# Patient Record
Sex: Female | Born: 1950 | Race: White | Hispanic: No | Marital: Married | Smoking: Never smoker
Health system: Southern US, Community
[De-identification: ages and names within clinical notes are randomized; demographics above are authoritative.]

## PROBLEM LIST (undated history)

## (undated) DIAGNOSIS — J329 Chronic sinusitis, unspecified: Secondary | ICD-10-CM

## (undated) DIAGNOSIS — F32A Depression, unspecified: Secondary | ICD-10-CM

## (undated) DIAGNOSIS — D869 Sarcoidosis, unspecified: Secondary | ICD-10-CM

## (undated) DIAGNOSIS — I10 Essential (primary) hypertension: Secondary | ICD-10-CM

## (undated) DIAGNOSIS — M1990 Unspecified osteoarthritis, unspecified site: Secondary | ICD-10-CM

## (undated) DIAGNOSIS — Z9889 Other specified postprocedural states: Secondary | ICD-10-CM

## (undated) DIAGNOSIS — R112 Nausea with vomiting, unspecified: Secondary | ICD-10-CM

## (undated) DIAGNOSIS — J309 Allergic rhinitis, unspecified: Secondary | ICD-10-CM

## (undated) DIAGNOSIS — F329 Major depressive disorder, single episode, unspecified: Secondary | ICD-10-CM

## (undated) DIAGNOSIS — E039 Hypothyroidism, unspecified: Secondary | ICD-10-CM

## (undated) DIAGNOSIS — E119 Type 2 diabetes mellitus without complications: Secondary | ICD-10-CM

## (undated) HISTORY — DX: Hypothyroidism, unspecified: E03.9

## (undated) HISTORY — DX: Sarcoidosis, unspecified: D86.9

## (undated) HISTORY — DX: Allergic rhinitis, unspecified: J30.9

## (undated) HISTORY — PX: FOOT SURGERY: SHX648

## (undated) HISTORY — PX: APPENDECTOMY: SHX54

## (undated) HISTORY — DX: Chronic sinusitis, unspecified: J32.9

## (undated) HISTORY — PX: CHOLECYSTECTOMY: SHX55

## (undated) HISTORY — DX: Essential (primary) hypertension: I10

## (undated) HISTORY — DX: Type 2 diabetes mellitus without complications: E11.9

---

## 1997-09-28 HISTORY — PX: NASAL SINUS SURGERY: SHX719

## 1998-01-16 ENCOUNTER — Ambulatory Visit (HOSPITAL_COMMUNITY): Admission: RE | Admit: 1998-01-16 | Discharge: 1998-01-16 | Payer: Self-pay | Admitting: Thoracic Surgery

## 1998-02-21 ENCOUNTER — Encounter: Admission: RE | Admit: 1998-02-21 | Discharge: 1998-02-21 | Payer: Self-pay | Admitting: Infectious Diseases

## 1998-03-22 ENCOUNTER — Encounter: Admission: RE | Admit: 1998-03-22 | Discharge: 1998-03-22 | Payer: Self-pay | Admitting: Infectious Diseases

## 1999-11-24 ENCOUNTER — Other Ambulatory Visit: Admission: RE | Admit: 1999-11-24 | Discharge: 1999-11-24 | Payer: Self-pay | Admitting: *Deleted

## 2001-04-01 ENCOUNTER — Ambulatory Visit (HOSPITAL_COMMUNITY): Admission: RE | Admit: 2001-04-01 | Discharge: 2001-04-01 | Payer: Self-pay | Admitting: Family Medicine

## 2001-04-01 ENCOUNTER — Encounter: Payer: Self-pay | Admitting: Family Medicine

## 2001-06-07 ENCOUNTER — Other Ambulatory Visit: Admission: RE | Admit: 2001-06-07 | Discharge: 2001-06-07 | Payer: Self-pay | Admitting: *Deleted

## 2002-06-22 ENCOUNTER — Other Ambulatory Visit: Admission: RE | Admit: 2002-06-22 | Discharge: 2002-06-22 | Payer: Self-pay | Admitting: *Deleted

## 2007-12-08 ENCOUNTER — Inpatient Hospital Stay (HOSPITAL_COMMUNITY): Admission: AD | Admit: 2007-12-08 | Discharge: 2007-12-11 | Payer: Self-pay | Admitting: Family Medicine

## 2007-12-08 ENCOUNTER — Ambulatory Visit: Payer: Self-pay | Admitting: Internal Medicine

## 2010-09-28 HISTORY — PX: HAND SURGERY: SHX662

## 2011-02-10 NOTE — Consult Note (Signed)
NAME:  Amy Hendrix, Amy Hendrix              ACCOUNT NO.:  [id number]   MEDICAL RECORD NO.:  [id number]          PATIENT TYPE:  INP   LOCATION:  A340                          FACILITY:  [facility]   PHYSICIAN:  R. Roetta Sessions, M.D. DATE OF BIRTH:  [birth year]   DATE OF CONSULTATION:  12/08/2007  DATE OF DISCHARGE:                                 CONSULTATION   REASON FOR CONSULTATION:  Abdominal pain, gastroenteritis.   REQUESTING PHYSICIAN:  Scott A. Luking, MD.   HISTORY OF PRESENT ILLNESS:  This patient is a 60 year old Caucasian  female with a one-week history of general malaise, loss of appetite,  nausea, vomiting, diarrhea.  She complains of chills.  She states she  was fine up until last Thursday.  She woke up.  Throughout the day she  started having fatigue and nausea and vomiting, developed diarrhea.  She  denies any hematemesis, melena or bright red blood per rectum.  She has  had loose stools but having less than five stools a day.  She has not  had any vomiting today.  She says she is not able to eat.  She denies  any chronic GERD, dysphagia, or odynophagia.  No unintentional weight  loss except for maybe the past few days.  She had labs as an outpatient.  Dr. Gerda Diss reported to [place] that her white count was 27,000 with 20%  bands.  Apparently her LFTs were normal.   MEDICATIONS AT HOME:  1. Levothyroxine 112 mcg daily.  2. Atenolol 50 mg daily.  3. Potassium chloride 20 mEq three daily.  4. Indapamide 2.5 mg daily.  5. Tylenol p.r.n.   ALLERGIES:  No known drug allergies.   PAST MEDICAL HISTORY:  1. Hypothyroidism.  2. Hypertension.  3. Diet-controlled diabetes mellitus, recently diagnosed.  4. She states she was given diagnosis of sarcoidosis back in 1999, but      has not required any chronic therapy.  5. Appendectomy in the 70s.  6. Cholecystectomy.  7. Two cesarean sections.  8. Bilateral foot surgery.  9. Nasal reconstruction.   FAMILY HISTORY:  Brother had colonic  polyps at age 61.   SOCIAL HISTORY:  She is married.  She has two children.  No tobacco or  alcohol use.  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   REVIEW OF SYSTEMS:  See HPI for GI and constitutional.  CARDIOPULMONARY:  No chest pain or shortness of breath.   PHYSICAL EXAMINATION:  VITAL SIGNS:  Temperature 99.3, pulse 80,  respirations 20, blood pressure 123/78, height 63 inches, weight 108.5  kg.  GENERAL:  Pleasant, obese Caucasian female in no acute distress.  SKIN:  Warm, dry.  No jaundice.  HEENT:  Sclerae nonicteric.  Oropharyngeal mucosa moist and pink, no  lesions, erythema or exudate.  No lymphadenopathy.  CHEST:  Clear to auscultation.  CARDIAC:  Exam reveals regular rate and rhythm.  No murmurs, rubs,  gallops.  ABDOMEN:  Positive bowel sounds,  soft, nontender, nondistended.  No  organomegaly or masses.  No rebound or guarding.  No abdominal bruits or  hernias.  LOWER  EXTREMITIES:  No edema.   IMPRESSION:  The patient is a 60 year old female with nausea, vomiting,  diarrhea and malaise for one week.  Most compelling symptom to her is  the malaise and loss of appetite.  She has had no vomiting today.  She  is not able to eat.  She has significant leukocytosis with bands.  Differential diagnosis includes gastroenteritis, bacterial versus food  born.  Less likely Clostridium difficile, but this needs to be excluded.  She gives interesting history of sarcoidosis, never requiring any  therapy, apparently was diagnosed based on lung biopsy.   PLAN:  1. Send stool for C. diff as well as culture and WBC as planned.  2. Clear liquid diet.  3. Supportive measures.  4. Follow-up pending studies.  5. Reevaluate in the morning.  6. Further recommendations to following.      [name], P.AJonathon Bellows, M.D.  Electronically Signed    LL/MEDQ  D:  12/08/2007  T:  12/08/2007  Job:  [id number]

## 2011-02-10 NOTE — Consult Note (Signed)
NAME:  Amy Hendrix, Amy Hendrix              ACCOUNT NO.:  [id number]   MEDICAL RECORD NO.:  [id number]          PATIENT TYPE:  INP   LOCATION:  A340                          FACILITY:  [facility]   PHYSICIAN:  R. Roetta Sessions, M.D. DATE OF BIRTH:  [birth year]   DATE OF CONSULTATION:  DATE OF DISCHARGE:                                 CONSULTATION   ADDENDUM   The patient has never had a screening colonoscopy.  She is to have one  at some point as an outpatient. Family history is significant for  colonic polyp, probable adenomatous based on her description and a  brother age 102.      [name], P.AJonathon Bellows, M.D.  Electronically Signed    LL/MEDQ  D:  12/08/2007  T:  12/08/2007  Job:  [id number]   cc:   Lorin Picket A. Gerda Diss, MD  Fax: [phone]

## 2011-02-10 NOTE — H&P (Signed)
NAMEADALIN, Amy Hendrix              ACCOUNT NO.:  [id number]   MEDICAL RECORD NO.:  [id number]          PATIENT TYPE:  INP   LOCATION:  A340                          FACILITY:  [facility]   PHYSICIAN:  Scott A. Gerda Diss, MD    DATE OF BIRTH:  Feb 06, 1951   DATE OF ADMISSION:  12/08/2007  DATE OF DISCHARGE:  LH                              HISTORY & PHYSICAL   CHIEF COMPLAINT:  Severe fatigue, nausea and vomiting, diarrhea, and  fever over the past week.   HISTORY OF PRESENT ILLNESS:  This 60 year old female presents first with  significant fatigue and tiredness earlier in the week, along with mild  headache, some fever and chills, slight nausea.  The nausea progressed  as the week went on.  In addition to this, also had some diarrhea, which  was mucousy.  In addition to this, she just did not feel like eating or  drinking.  She states she got progressively weaker as the week went on.  She states her urination has become less, and she relates that she has  had ongoing chills and fever as the week has gone on as well, along with  some intermittent abdominal cramps and just severe fatigue and  exhaustion.  Denies coughing, shortness of breath, chest pressure,  tightness.   PAST MEDICAL HISTORY:  Sinusitis, HTN, sarcoidosis, hypothyroidism,  allergic rhinitis, diabetes.   SURGICAL HISTORY:  Appendectomy, C-section, cholecystectomy and sinus  surgery.   SOCIAL HISTORY:  She is retired Runner, broadcasting/film/video married, does not smoke or  drink.   FAMILY HISTORY:  Has a family history of diabetes.   ALLERGIES:  NOT ALLERGIC TO ANY MEDICINES.   MEDICATIONS:  1. Takes Lozol 2.5 mg daily.  2. K-Dur 20 mEq 3 daily.  3. Atenolol 50 mg daily.  4. Levothyroxine 112 mcg daily.   REVIEW OF SYSTEMS:  Negative for joint pain, rash.  Positive for body  aches, headache, nausea, vomiting, diarrhea, cramps, mucousy stools.  Negative for dysuria.   LABORATORY:  CBC:  27,000, white count with a left shift and greater  than 20% bands, sodium 133, potassium 4.0, platelet count 334,  creatinine 1.41, glucose 176, BUN 23, bilirubin 2, alk phos 125, ALT 38,  albumin 2.9, amylase 8.   PHYSICAL EXAM:  HEENT:  TMs NL.  NECK:  No masses.  CHEST:  CTA.  No crackles.  HEART:  Regular.  ABDOMEN:  Soft.  No guarding, no rebound.  EXTREMITIES:  No edema.  SKIN:  Warm and dry.  Looks to feel ill.   ASSESSMENT/PLAN:  Gastroenteritis, viral verses possible bacterial  infection, diarrhea.  I recommend going ahead and culturing the blood,  as well as the urine.  Plus, I also recommend that we monitor the  patient closely, give IV fluids.  Await lab results of recheck in the  morning, as well as cover with Zosyn and Flagyl and expect slowed  gradual improvement over the course next 48 hours and monitor her blood  culture results, as well.      Scott A. Gerda Diss, MD  Electronically Signed     SAL/MEDQ  D:  12/08/2007  T:  12/09/2007  Job:  [id number]

## 2011-02-13 NOTE — Discharge Summary (Signed)
Amy Hendrix, Amy Hendrix              ACCOUNT NO.:  [id number]   MEDICAL RECORD NO.:  [id number]          PATIENT TYPE:  INP   LOCATION:  A340                          FACILITY:  [facility]   PHYSICIAN:  Donna Bernard, M.D.DATE OF BIRTH:  Apr 30, 1951   DATE OF ADMISSION:  12/08/2007  DATE OF DISCHARGE:  03/15/2009LH                               DISCHARGE SUMMARY   FINAL DIAGNOSES:  1. Gastroenteritis.  2. Hypertension.  3. Hypothyroidism.  4. Sarcoidosis.   FINAL DISPOSITION:  1. The patient is discharged to home.  2. Discharge medications, maintain all usual medicines plus Cipro 500      b.i.d. for 7 days, Reglan 5 one every 4-6 hours as needed for      nausea.  3. Follow up in the office at usual scheduled interval.  4. Contact us if abdominal symptoms persist or worsen.  No milk      products, fried foods next several days.   INITIAL HISTORY AND PHYSICAL:  Please see H&P as dictated.   HOSPITAL COURSE:  This patient is a 60 year old female who arrived in  the office during the week with fatigue, tiredness, headache, fever,  chills, nausea.  She had progressing nausea, and then developed frank  severe diarrhea.  She noted significant abdominal cramps.  She presented  with severe intermittent abdominal cramps.  Blood work was done, CBC of  27, white blood count of 27,000 greater 20% bands, creatinine elevated  somewhat at 1.4.  She was admitted to hospital.  IV fluids were given.  IV antibiotics were administered.  The patient responded slowly.  Zosyn  was given.  IV Dilaudid was administered for pain.  GI folks were  consulted.  Flagyl 500 q.8 h. was added to the patient's regimen.  Stool  cultures eventually returned negative.  GI folks felt that the patient  was suffering from some type of colitis.  They recommended a round of  antibiotics during recovery at home.  On the day of discharge, the  patient was feeling somewhat better.  Abdominal exam improved.  All her  cultures  were still returning negative.  The patient was discharged home  with diagnosis and disposition as noted above.  It should be noted that  the patient's creatinine returned to normal as did her white blood  count.      Donna Bernard, M.D.  Electronically Signed     WSL/MEDQ  D:  01/02/2008  T:  01/02/2008  Job:  [id number]

## 2011-06-16 ENCOUNTER — Ambulatory Visit (HOSPITAL_BASED_OUTPATIENT_CLINIC_OR_DEPARTMENT_OTHER)
Admission: RE | Admit: 2011-06-16 | Discharge: 2011-06-16 | Disposition: A | Discharge status: Home / Self Care | Payer: BC Managed Care – PPO | Source: Ambulatory Visit | Attending: Orthopedic Surgery | Admitting: Orthopedic Surgery

## 2011-06-16 DIAGNOSIS — Z01810 Encounter for preprocedural cardiovascular examination: Secondary | ICD-10-CM | POA: Insufficient documentation

## 2011-06-16 DIAGNOSIS — E119 Type 2 diabetes mellitus without complications: Secondary | ICD-10-CM | POA: Insufficient documentation

## 2011-06-16 DIAGNOSIS — E669 Obesity, unspecified: Secondary | ICD-10-CM | POA: Insufficient documentation

## 2011-06-16 DIAGNOSIS — I10 Essential (primary) hypertension: Secondary | ICD-10-CM | POA: Insufficient documentation

## 2011-06-16 DIAGNOSIS — L02519 Cutaneous abscess of unspecified hand: Secondary | ICD-10-CM | POA: Insufficient documentation

## 2011-06-16 DIAGNOSIS — M629 Disorder of muscle, unspecified: Secondary | ICD-10-CM | POA: Insufficient documentation

## 2011-06-16 DIAGNOSIS — D869 Sarcoidosis, unspecified: Secondary | ICD-10-CM | POA: Insufficient documentation

## 2011-06-16 DIAGNOSIS — M6789 Other specified disorders of synovium and tendon, multiple sites: Secondary | ICD-10-CM | POA: Insufficient documentation

## 2011-06-16 DIAGNOSIS — L03019 Cellulitis of unspecified finger: Secondary | ICD-10-CM | POA: Insufficient documentation

## 2011-06-16 DIAGNOSIS — M2420 Disorder of ligament, unspecified site: Secondary | ICD-10-CM | POA: Insufficient documentation

## 2011-06-16 LAB — POCT I-STAT, CHEM 8
BUN: 20 mg/dL (ref 6–23)
Calcium, Ion: 1.19 mmol/L (ref 1.12–1.32)
Chloride: 105 mEq/L (ref 96–112)
Creatinine, Ser: 1.1 mg/dL (ref 0.50–1.10)
Glucose, Bld: 120 mg/dL — ABNORMAL HIGH (ref 70–99)
HCT: 43 % (ref 36.0–46.0)
Hemoglobin: 14.6 g/dL (ref 12.0–15.0)
Potassium: 4.2 mEq/L (ref 3.5–5.1)
Sodium: 141 mEq/L (ref 135–145)
TCO2: 25 mmol/L (ref 0–100)

## 2011-06-16 LAB — GLUCOSE, CAPILLARY: Glucose-Capillary: 112 mg/dL — ABNORMAL HIGH (ref 70–99)

## 2011-06-18 NOTE — Op Note (Signed)
[name], Hendrix              ACCOUNT NO.:  [id number]  MEDICAL RECORD NO.:  [id number]  LOCATION:                                 FACILITY:  PHYSICIAN:  Amy Fitch. Palestine Mosco, M.D.      DATE OF BIRTH:  DATE OF PROCEDURE:  06/16/2011 DATE OF DISCHARGE:                              OPERATIVE REPORT   PREOPERATIVE DIAGNOSIS:  Collar-button abscess, left long index webspace, present for more than 10 days.  POSTOPERATIVE DIAGNOSIS:  Collar-button abscess, left long index webspace, present for more than 10 days.  OPERATIONS:  Incision and drainage of left hand long index webspace collar-button abscess with sinus tract deep to lumbrical muscle long finger.  OPERATIONS: 1. Incision and drainage of palmar deep space abscess. 2. Incision and drainage of dorsal intramuscular abscess and placement     of Penrose palm to dorsal through-and-through drain with aerobic     and anaerobic cultures.  OPERATING SURGEON:  Amy Fitch. Avyn Aden, MD  ASSISTANT:  Marveen Reeks Dasnoit, PA-C  ANESTHESIA:  General by LMA.  SUPERVISING ANESTHESIOLOGIST:  Bedelia Person, MD  INDICATIONS:  Amy Hendrix is a 60 year old woman referred through the courtesy of Dr. Albertha Ghee, Sports Medicine and Orthopedics from the [place], [place], office.  Amy Hendrix had pain for more than 10 days in her left hand adjacent to the long finger metacarpal head region.  She had no history of penetrating injury, no history of gout, and no history of generalized sepsis.  However, she has type 2 diabetes that is poorly controlled.  At the present time, she is not taking in any medications.  She reports that she checks her sugars 3 days a week and her most recent fasting glucose was 140.  She was unaware of her hemoglobin A1c.  On examination, she had what appeared to be a mature collar-button abscess with induration in the palm at the long index webspace, marked tenderness, rubor, and a dorsal fluid collection.  We recommended  immediate incision and drainage.  During the past 24-hour, she has been started on IM Rocephin and had p.o. Cipro and p.o. Augmentin.  This combination of antibiotics might lead to very significant diarrhea. We advised that we obtained cultures; however, with this significant antibiotic exposure for the past 24 hours, we may have a muted culture response.  The primary indication for surgery is incision and drainage of the abscess.  This may be the definitive maneuver to correct this predicament.  After informed consent, she was brought to the operating room at this time.  PROCEDURE:  Amy Hendrix was brought to room 2 of the [facility] and placed in supine position up on the operating table.  Following preoperative informed consent by Dr. Gypsy Balsam, general anesthesia by LMA technique was induced.  The left arm and hand were prepped with Betadine soap and solution, sterilely draped.  A pneumatic tourniquet was applied to proximal left brachium.  Following elevation of the hand for 1 minute and direct compression of the forearm, the arterial tourniquet was inflated at 250 mmHg.  Following a routine surgical time-out, a Brunner zigzag incision was fashioned incorporating a portion of the distal palmar crease. Subcutaneous tissues  were carefully divided revealing a very indurated abscess with turbid fluid and saponified fat.  This was on the radial aspect of long finger flexor sheath and a sinus tract extended radial to the flexor sheath and ulnar to the lumbrical muscle to the dorsum of the hand where second pocket was identified in the subcutaneous and within the interosseous muscles.  This was relieved by spreading with blunt hemostat.  Meticulous spreading and exploration of the palmar space was accomplished until all areas of indurated subcutaneous fat were released and fluid released.  The fluid was cultured for aerobic and anaerobic growth.  Samples of  saponified fat were sent for culture as well.  The flexor sheath did not appear to be directly involved with the abscess.  The wound was then irrigated through-and-through followed by placement of a through-and-through Penrose and quarter inch drain.  The wounds were dressed open with Adaptic sterile gauze, sterile Kerlix, sterile Webril, and an Ace bandage.  For aftercare, Amy Hendrix is provided prescription for Percocet 5/325 one to two tablets p.o. q.4-6 hours p.r.n. pain, 30 tablets, without refill; also doxycycline 100 mg p.o. b.i.d. x7 days.  Once we have culture results, we may modify her antibiotic exposure.  We have advised her to be wary of potential diarrhea due to the polyantibiotic exposure she has had in the past 24 hours, but 1 gram of vancomycin as IV antibiotic after appropriate cultures were obtained. There is a significant risk that this could be an MRSA infection that would not be covered by the other antibiotic combination.     Amy Hendrix, M.D.     RVS/MEDQ  D:  06/16/2011  T:  06/17/2011  Job:  [id number]  cc:   Lacretia Nicks. Simone Curia, M.D. Melina Fiddler, MD  Electronically Signed by Josephine Igo M.D. on 06/18/2011 11:39:25 AM

## 2011-06-19 LAB — WOUND CULTURE: Culture: NO GROWTH

## 2011-06-22 LAB — HEPATIC FUNCTION PANEL
AST: 19
Albumin: 2.2 — ABNORMAL LOW
Alkaline Phosphatase: 110
Alkaline Phosphatase: 91
Indirect Bilirubin: 0.8
Total Bilirubin: 1.4 — ABNORMAL HIGH
Total Bilirubin: 2.1 — ABNORMAL HIGH
Total Protein: 6.1

## 2011-06-22 LAB — ANAEROBIC CULTURE

## 2011-06-22 LAB — DIFFERENTIAL
Basophils Relative: 0
Eosinophils Absolute: 0
Eosinophils Absolute: 0.1
Eosinophils Relative: 0
Eosinophils Relative: 1
Lymphs Abs: 2
Lymphs Abs: 2.4
Lymphs Abs: 1.7
Monocytes Absolute: 0.7
Monocytes Relative: 6
Monocytes Relative: 6
Monocytes Relative: 6
Neutro Abs: 12.6 — ABNORMAL HIGH
Neutrophils Relative %: 80 — ABNORMAL HIGH

## 2011-06-22 LAB — URINE CULTURE: Special Requests: NEGATIVE

## 2011-06-22 LAB — CBC
HCT: 38.6
HCT: 36.3
Hemoglobin: 12.9
MCV: 87.8
MCV: 86.6
Platelets: 358
Platelets: 382
RBC: 3.94
WBC: 15.7 — ABNORMAL HIGH
WBC: 23.9 — ABNORMAL HIGH
WBC: 12 — ABNORMAL HIGH

## 2011-06-22 LAB — BASIC METABOLIC PANEL
BUN: 25 — ABNORMAL HIGH
BUN: 14
CO2: 27
Calcium: 7.8 — ABNORMAL LOW
Chloride: 99
Chloride: 96
Creatinine, Ser: 1.26 — ABNORMAL HIGH
GFR calc Af Amer: 53 — ABNORMAL LOW
Potassium: 4
Potassium: 3.7
Sodium: 134 — ABNORMAL LOW

## 2011-06-22 LAB — CULTURE, BLOOD (ROUTINE X 2): Report Status: 3172009

## 2011-06-22 LAB — URINE MICROSCOPIC-ADD ON

## 2011-06-22 LAB — URINALYSIS, ROUTINE W REFLEX MICROSCOPIC
Leukocytes, UA: NEGATIVE
Nitrite: POSITIVE — AB
Specific Gravity, Urine: 1.025
pH: 5.5

## 2011-06-22 LAB — HEMOGLOBIN A1C: Mean Plasma Glucose: 143

## 2011-06-22 LAB — CLOSTRIDIUM DIFFICILE EIA

## 2011-06-22 LAB — STOOL CULTURE

## 2011-06-22 LAB — FECAL LACTOFERRIN, QUANT

## 2012-08-15 ENCOUNTER — Other Ambulatory Visit: Payer: Self-pay | Admitting: Family Medicine

## 2012-08-15 DIAGNOSIS — N6310 Unspecified lump in the right breast, unspecified quadrant: Secondary | ICD-10-CM

## 2012-08-24 ENCOUNTER — Ambulatory Visit (HOSPITAL_COMMUNITY)
Admission: RE | Admit: 2012-08-24 | Discharge: 2012-08-24 | Disposition: A | Discharge status: Home / Self Care | Payer: BC Managed Care – PPO | Source: Ambulatory Visit | Attending: Family Medicine | Admitting: Family Medicine

## 2012-08-24 DIAGNOSIS — N6310 Unspecified lump in the right breast, unspecified quadrant: Secondary | ICD-10-CM

## 2012-08-24 DIAGNOSIS — N63 Unspecified lump in unspecified breast: Secondary | ICD-10-CM | POA: Insufficient documentation

## 2012-08-30 ENCOUNTER — Other Ambulatory Visit: Payer: Self-pay | Admitting: Family Medicine

## 2012-08-30 DIAGNOSIS — M8580 Other specified disorders of bone density and structure, unspecified site: Secondary | ICD-10-CM

## 2012-10-10 ENCOUNTER — Ambulatory Visit (HOSPITAL_COMMUNITY)
Admission: RE | Admit: 2012-10-10 | Discharge: 2012-10-10 | Disposition: A | Discharge status: Home / Self Care | Payer: BC Managed Care – PPO | Source: Ambulatory Visit | Attending: Family Medicine | Admitting: Family Medicine

## 2012-10-10 DIAGNOSIS — M8580 Other specified disorders of bone density and structure, unspecified site: Secondary | ICD-10-CM

## 2012-10-10 DIAGNOSIS — M899 Disorder of bone, unspecified: Secondary | ICD-10-CM | POA: Insufficient documentation

## 2013-01-12 ENCOUNTER — Other Ambulatory Visit: Payer: Self-pay | Admitting: Family Medicine

## 2013-02-17 ENCOUNTER — Encounter: Payer: Self-pay | Admitting: *Deleted

## 2013-02-21 ENCOUNTER — Encounter: Payer: Self-pay | Admitting: Family Medicine

## 2013-02-21 ENCOUNTER — Ambulatory Visit (INDEPENDENT_AMBULATORY_CARE_PROVIDER_SITE_OTHER): Payer: BC Managed Care – PPO | Admitting: Family Medicine

## 2013-02-21 VITALS — BP 132/82 | Wt 249.0 lb

## 2013-02-21 DIAGNOSIS — I10 Essential (primary) hypertension: Secondary | ICD-10-CM | POA: Insufficient documentation

## 2013-02-21 DIAGNOSIS — D869 Sarcoidosis, unspecified: Secondary | ICD-10-CM | POA: Insufficient documentation

## 2013-02-21 DIAGNOSIS — E1149 Type 2 diabetes mellitus with other diabetic neurological complication: Secondary | ICD-10-CM

## 2013-02-21 DIAGNOSIS — E119 Type 2 diabetes mellitus without complications: Secondary | ICD-10-CM | POA: Insufficient documentation

## 2013-02-21 DIAGNOSIS — E039 Hypothyroidism, unspecified: Secondary | ICD-10-CM | POA: Insufficient documentation

## 2013-02-21 LAB — POCT GLYCOSYLATED HEMOGLOBIN (HGB A1C): Hemoglobin A1C: 6.4

## 2013-02-21 NOTE — Patient Instructions (Signed)
Take all meds as directed

## 2013-02-21 NOTE — Progress Notes (Signed)
  Subjective:    Patient ID: Amy Hendrix, female    DOB: Jul 01, 1951, 62 y.o.   MRN: [id number]  Diabetes She has type 2 diabetes mellitus. Her disease course has been stable. Pertinent negatives for hypoglycemia include no confusion or dizziness. Pertinent negatives for diabetes include no blurred vision, no chest pain and no fatigue. Pertinent negatives for hypoglycemia complications include no blackouts. Symptoms are stable. There are no diabetic complications. Risk factors for coronary artery disease include diabetes mellitus and hypertension. Current diabetic treatment includes oral agent (dual therapy). She is compliant with treatment most of the time. Her weight is increasing steadily. She is following a diabetic diet. Meal planning includes avoidance of concentrated sweets. She has not had a previous visit with a dietician. She participates in exercise three times a week. Her home blood glucose trend is fluctuating minimally. Her breakfast blood glucose is taken between 8-9 am. Her breakfast blood glucose range is generally 110-130 mg/dl. An ACE inhibitor/angiotensin II receptor blocker is not being taken.   patient notes overall her sarcoidosis is stable. She notes occasional shortness of breath. None severe. Unfortunately not exercising.  Patient reports good control of blood pressure. Compliant with medication. No obvious side effects. Blood pressures good when checked elsewhere.    Review of Systems  Constitutional: Negative for fatigue.  Eyes: Negative for blurred vision.  Cardiovascular: Negative for chest pain.  Neurological: Negative for dizziness.  Psychiatric/Behavioral: Negative for confusion.   ROS otherwise negative.     Objective:   Physical Exam   Alert, obesity present. HEENT normal. Vitals stable. Lungs clear. Heart regular in rhythm. Ankles without edema.     Assessment & Plan:  Impression #1 type 2 diabetes control good at 6.4 discussed. #2 hypertension good  control discussed. #3 sarcoidosis clinically stable at this time discussed. #4 low thyroid. Discussed. Plan maintain same meds. Diet exercise discussed. Encouraged to exercise. WSL

## 2013-04-17 ENCOUNTER — Other Ambulatory Visit: Payer: Self-pay | Admitting: Family Medicine

## 2013-05-30 ENCOUNTER — Ambulatory Visit: Payer: BC Managed Care – PPO | Admitting: Family Medicine

## 2013-06-02 ENCOUNTER — Ambulatory Visit (HOSPITAL_COMMUNITY)
Admission: RE | Admit: 2013-06-02 | Discharge: 2013-06-02 | Disposition: A | Discharge status: Home / Self Care | Payer: BC Managed Care – PPO | Source: Ambulatory Visit | Attending: Family Medicine | Admitting: Family Medicine

## 2013-06-02 ENCOUNTER — Telehealth: Payer: Self-pay | Admitting: Family Medicine

## 2013-06-02 ENCOUNTER — Ambulatory Visit (INDEPENDENT_AMBULATORY_CARE_PROVIDER_SITE_OTHER): Payer: BC Managed Care – PPO | Admitting: Family Medicine

## 2013-06-02 ENCOUNTER — Encounter: Payer: Self-pay | Admitting: Family Medicine

## 2013-06-02 VITALS — BP 118/82 | Ht 63.0 in | Wt 249.5 lb

## 2013-06-02 DIAGNOSIS — M79672 Pain in left foot: Secondary | ICD-10-CM

## 2013-06-02 DIAGNOSIS — M79609 Pain in unspecified limb: Secondary | ICD-10-CM

## 2013-06-02 DIAGNOSIS — M7730 Calcaneal spur, unspecified foot: Secondary | ICD-10-CM | POA: Insufficient documentation

## 2013-06-02 MED ORDER — HYDROCODONE-ACETAMINOPHEN 5-325 MG PO TABS: 1.0000 | ORAL_TABLET | Freq: Four times a day (QID) | ORAL | Status: DC | PRN

## 2013-06-02 MED ORDER — DICLOFENAC SODIUM 75 MG PO TBEC: 75.0000 mg | DELAYED_RELEASE_TABLET | Freq: Two times a day (BID) | ORAL | Status: DC

## 2013-06-02 NOTE — Telephone Encounter (Signed)
Patient does want the Vicodin called in- Rx printed and faxed to [facility]. Patient notified.

## 2013-06-02 NOTE — Telephone Encounter (Signed)
Patient would like Rx for pain she is having and seen for today. Please call when complete.  [facility]

## 2013-06-02 NOTE — Progress Notes (Signed)
  Subjective:    Patient ID: Amy Hendrix, female    DOB: 05-Jul-1951, 62 y.o.   MRN: [id number]  Foot Injury  The incident occurred more than 1 week ago. There was no injury mechanism. The pain is present in the left foot. The quality of the pain is described as aching and shooting. The pain is at a severity of 8/10. The pain is severe. The pain has been constant since onset. Associated symptoms include an inability to bear weight. She reports no foreign bodies present. The symptoms are aggravated by weight bearing. She has tried nothing for the symptoms. The treatment provided no relief.   Started at the base of the big toe,  Felt relatred to prior history  No morr walking than usual  Usually wears birkenstocks or bear foot. Painful at first step     Review of Systems No significant pain elsewhere.    Objective:   Physical Exam  Alert no acute distress. Lungs clear. Heart regular rate and rhythm. Distal foot pulses good sensation intact distinct tenderness at base of her second metatarsal.      Assessment & Plan:  Impression foot pain history of surgery patient stated joint replacement but x-ray revealed pinning. 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 Plan Voltaren twice a day with food. Local measures discussed. Proper footwear discussed. If persists will need to see podiatrist. WSL

## 2013-06-02 NOTE — Telephone Encounter (Signed)
Tell pt the voltaren is for pain. If she'd like a narcotic, take only in the evening or when staying at home. numb 24 hydrocod 5/326 one q 6 prn pain no ref

## 2013-06-03 ENCOUNTER — Telehealth: Payer: Self-pay | Admitting: Family Medicine

## 2013-06-03 NOTE — Telephone Encounter (Signed)
Received call on weekend from [facility]. Pt requesting narcotic to be called in. Reviewed chart - seems pt was faxed in vicodin yesterday to [facility]. Inappropriate to call in more narcotics a day later. If her pain is uncontrolled she needs to be seen in UC or the ED. Has volteren at home she can take and presumably the vicodin. She should discuss with her pc during normal office hours next week. No meds called in.

## 2013-06-04 NOTE — Telephone Encounter (Signed)
Nurses to see

## 2013-06-05 NOTE — Telephone Encounter (Signed)
[facility] pharmacy and they stated that they never received a fax on Friday for the hydrocodone and they never filled it. Patient went all weekend without this medication. Gave pharmacist a verbal order for the hydrocodone and will notify patient that we called it in and apologize for any inconvenience.

## 2013-06-05 NOTE — Telephone Encounter (Signed)
thanks

## 2013-06-20 ENCOUNTER — Other Ambulatory Visit: Payer: Self-pay | Admitting: Family Medicine

## 2013-06-21 NOTE — Telephone Encounter (Signed)
Last office visit 06/02/13

## 2013-07-11 ENCOUNTER — Other Ambulatory Visit: Payer: Self-pay | Admitting: Family Medicine

## 2013-07-18 ENCOUNTER — Other Ambulatory Visit: Payer: Self-pay | Admitting: Family Medicine

## 2013-07-19 ENCOUNTER — Other Ambulatory Visit: Payer: Self-pay | Admitting: Family Medicine

## 2013-08-14 ENCOUNTER — Other Ambulatory Visit: Payer: Self-pay | Admitting: Family Medicine

## 2013-08-15 ENCOUNTER — Ambulatory Visit (INDEPENDENT_AMBULATORY_CARE_PROVIDER_SITE_OTHER): Payer: BC Managed Care – PPO | Admitting: Family Medicine

## 2013-08-15 ENCOUNTER — Encounter: Payer: Self-pay | Admitting: Family Medicine

## 2013-08-15 VITALS — BP 128/82 | Ht 63.0 in | Wt 251.0 lb

## 2013-08-15 DIAGNOSIS — D869 Sarcoidosis, unspecified: Secondary | ICD-10-CM

## 2013-08-15 DIAGNOSIS — E039 Hypothyroidism, unspecified: Secondary | ICD-10-CM

## 2013-08-15 DIAGNOSIS — E119 Type 2 diabetes mellitus without complications: Secondary | ICD-10-CM

## 2013-08-15 DIAGNOSIS — Z79899 Other long term (current) drug therapy: Secondary | ICD-10-CM

## 2013-08-15 DIAGNOSIS — E1149 Type 2 diabetes mellitus with other diabetic neurological complication: Secondary | ICD-10-CM

## 2013-08-15 DIAGNOSIS — I10 Essential (primary) hypertension: Secondary | ICD-10-CM

## 2013-08-15 DIAGNOSIS — R5381 Other malaise: Secondary | ICD-10-CM

## 2013-08-15 LAB — LIPID PANEL
Cholesterol: 175 mg/dL (ref 0–200)
HDL: 33 mg/dL — ABNORMAL LOW (ref >39)
Total CHOL/HDL Ratio: 5.3 Ratio

## 2013-08-15 LAB — BASIC METABOLIC PANEL
CO2: 29 mEq/L (ref 19–32)
Calcium: 9.6 mg/dL (ref 8.4–10.5)
Creat: 1.17 mg/dL — ABNORMAL HIGH (ref 0.50–1.10)
Glucose, Bld: 126 mg/dL — ABNORMAL HIGH (ref 70–99)
Sodium: 139 mEq/L (ref 135–145)

## 2013-08-15 LAB — HEPATIC FUNCTION PANEL
ALT: 28 U/L (ref 0–35)
AST: 26 U/L (ref 0–37)
Albumin: 4.1 g/dL (ref 3.5–5.2)
Alkaline Phosphatase: 48 U/L (ref 39–117)
Indirect Bilirubin: 0.5 mg/dL (ref 0.0–0.9)
Total Bilirubin: 0.7 mg/dL (ref 0.3–1.2)

## 2013-08-15 LAB — TSH: TSH: 2.883 u[IU]/mL (ref 0.350–4.500)

## 2013-08-15 LAB — POCT GLYCOSYLATED HEMOGLOBIN (HGB A1C): Hemoglobin A1C: 6.1

## 2013-08-15 MED ORDER — LEVOTHYROXINE SODIUM 112 MCG PO TABS: 112.0000 ug | ORAL_TABLET | Freq: Every day | ORAL | Status: DC

## 2013-08-15 MED ORDER — DICLOFENAC SODIUM 75 MG PO TBEC: 75.0000 mg | DELAYED_RELEASE_TABLET | Freq: Two times a day (BID) | ORAL | Status: DC

## 2013-08-15 MED ORDER — POTASSIUM CHLORIDE CRYS ER 20 MEQ PO TBCR: EXTENDED_RELEASE_TABLET | ORAL | Status: DC

## 2013-08-15 MED ORDER — METFORMIN HCL 500 MG PO TABS: 500.0000 mg | ORAL_TABLET | Freq: Every day | ORAL | Status: DC

## 2013-08-15 MED ORDER — GLYBURIDE 2.5 MG PO TABS: 2.5000 mg | ORAL_TABLET | Freq: Every day | ORAL | Status: DC

## 2013-08-15 MED ORDER — INDAPAMIDE 2.5 MG PO TABS: 2.5000 mg | ORAL_TABLET | Freq: Every day | ORAL | Status: DC

## 2013-08-15 MED ORDER — ATENOLOL 50 MG PO TABS: 50.0000 mg | ORAL_TABLET | Freq: Every day | ORAL | Status: DC

## 2013-08-15 NOTE — Progress Notes (Signed)
  Subjective:    Patient ID: Amy Hendrix, female    DOB: March 27, 1951, 62 y.o.   MRN: [id number]  HPI Patient arrives for a follow up on diabetes. Glu's running good untilo strips expired. Usually between 120 to 140. No low sugar spells except when skipping meal.  BP not cked elsewhere.  Not much on the exervise, walks some with shopping  Eye doc appt dec 5  bresathing relatively stable with hx of sarcoidosis, no major touble with breathing  Had flu shot already  Results for orders placed in visit on 08/15/13  POCT GLYCOSYLATED HEMOGLOBIN (HGB A1C)      Result Value Range   Hemoglobin A1C 6.1        Review of Systems No headache no chest pain no weight gain no weight loss no abdominal pain no change in bowel habits no blood in stool ROS otherwise negative    Objective:   Physical Exam Alert HEENT normal. Lungs clear. Heart regular rate and rhythm. Ankles without edema. Blood pressure good on repeat.       Assessment & Plan:  Impression 1 type 2 diabetes good control. #2 hypertension good control. #3 sarcoidosis clinically stable. #4 hypothyroidism. Plan diet exercise discussed. Maintain same medications. Followup as scheduled. WSL

## 2013-08-29 ENCOUNTER — Encounter: Payer: Self-pay | Admitting: Family Medicine

## 2013-09-08 ENCOUNTER — Ambulatory Visit (INDEPENDENT_AMBULATORY_CARE_PROVIDER_SITE_OTHER): Payer: BC Managed Care – PPO | Admitting: Family Medicine

## 2013-09-08 ENCOUNTER — Encounter: Payer: Self-pay | Admitting: Family Medicine

## 2013-09-08 VITALS — BP 132/84 | Temp 98.6°F | Ht 63.0 in | Wt 248.0 lb

## 2013-09-08 DIAGNOSIS — J019 Acute sinusitis, unspecified: Secondary | ICD-10-CM

## 2013-09-08 MED ORDER — AZITHROMYCIN 250 MG PO TABS: ORAL_TABLET | ORAL | Status: DC

## 2013-09-08 NOTE — Progress Notes (Signed)
   Subjective:    Patient ID: Amy Hendrix, female    DOB: 1951-03-18, 62 y.o.   MRN: [id number]  Cough This is a new problem. The current episode started in the past 7 days. Associated symptoms include myalgias, postnasal drip, a sore throat, shortness of breath and wheezing. Treatments tried: benadryl. The treatment provided moderate relief.   Itchy ears Chest congestion/  No N V PMH benign. Does not smoke.  Review of Systems  HENT: Positive for postnasal drip and sore throat.   Respiratory: Positive for cough, shortness of breath and wheezing.   Musculoskeletal: Positive for myalgias.       Objective:   Physical Exam  Eardrums normal throat is normal mild sinus tenderness neck is supple no masses lungs are clear no crackles heart is regular      Assessment & Plan:  Viral syndrome with secondary sinusitis antibiotics prescribed Z-Pak it doesn't get better with this notify us and we will call in additional medicine warning signs were discussed.

## 2013-09-12 ENCOUNTER — Telehealth: Payer: Self-pay | Admitting: Family Medicine

## 2013-09-12 MED ORDER — BENZONATATE 100 MG PO CAPS: 100.0000 mg | ORAL_CAPSULE | Freq: Four times a day (QID) | ORAL | Status: DC | PRN

## 2013-09-12 NOTE — Telephone Encounter (Signed)
Pt calling to say she is feeling a little better but the cough is worse than before.  She wants to know if you can call her in some cough meds (not hycodan) something like tessalon pearls Or benzonatate please to [facility]

## 2013-09-12 NOTE — Telephone Encounter (Signed)
Amy Hendrix 100 mg numb 30 one q 6 hr prn cough

## 2013-09-12 NOTE — Telephone Encounter (Signed)
Med sent to pharm. Pt notified.  

## 2013-09-15 ENCOUNTER — Telehealth: Payer: Self-pay | Admitting: Family Medicine

## 2013-09-15 MED ORDER — AMOXICILLIN-POT CLAVULANATE 875-125 MG PO TABS: 1.0000 | ORAL_TABLET | Freq: Two times a day (BID) | ORAL | Status: AC

## 2013-09-15 NOTE — Telephone Encounter (Signed)
Pt not feeling any better after finishing zpak, still has congested head, excessive mucous, cough, fatigue, low grade intermittent fever, chills   You told her if she wasn't better to call back an she can have another script  [address] pharmacy

## 2013-09-15 NOTE — Telephone Encounter (Signed)
Rx sent electronically to pharmacy. Patient notified. 

## 2013-09-15 NOTE — Telephone Encounter (Signed)
Aug 875 bid ten d 

## 2013-09-28 HISTORY — PX: EYE SURGERY: SHX253

## 2014-02-14 ENCOUNTER — Ambulatory Visit (INDEPENDENT_AMBULATORY_CARE_PROVIDER_SITE_OTHER): Payer: BC Managed Care – PPO | Admitting: Family Medicine

## 2014-02-14 ENCOUNTER — Encounter: Payer: Self-pay | Admitting: Family Medicine

## 2014-02-14 VITALS — BP 102/64 | Temp 98.9°F | Ht 63.0 in | Wt 244.0 lb

## 2014-02-14 DIAGNOSIS — B349 Viral infection, unspecified: Secondary | ICD-10-CM

## 2014-02-14 DIAGNOSIS — B9789 Other viral agents as the cause of diseases classified elsewhere: Secondary | ICD-10-CM

## 2014-02-14 DIAGNOSIS — J329 Chronic sinusitis, unspecified: Secondary | ICD-10-CM

## 2014-02-14 MED ORDER — CIPROFLOXACIN HCL 500 MG PO TABS
500.0000 mg | ORAL_TABLET | Freq: Two times a day (BID) | ORAL | Status: AC
Start: 2014-02-14 — End: 2014-02-24

## 2014-02-14 NOTE — Progress Notes (Signed)
   Subjective:    Patient ID: Amy Hendrix, female    DOB: 06/13/1951, 63 y.o.   MRN: [id number]  Diarrhea  This is a new problem. The current episode started in the past 7 days. Associated symptoms include chills and headaches. Associated symptoms comments: Sleepiness, not eating. Treatments tried: aleve, asa, diclofenac, sudafed.    Super weak, suddenly came on  Developed rigors  Going freq  Quit eating, decent fluid intake  Mild nausea, not bad,  No sig cough  Temp not cked,  Felt cold and achey  Chilled and diminished energy  Review of Systems  Constitutional: Positive for chills.  Gastrointestinal: Positive for diarrhea.  Neurological: Positive for headaches.       Objective:   Physical Exam  Alert mild malaise. HEENT moderate nasal congestion frontal tenderness neck supple. Lungs clear. Heart regular in rhythm. Abdomen good bowel sounds mild left lower quadrant tenderness.      Assessment & Plan:  Impression probable viral syndrome though cannot rule out sinusitis and/or bacterial colitis. With underlying diabetes and symptoms will cover a bacterial component plan symptomatic care discussed. Cipro 500 twice a day 10 days. Symptomatic care discussed. WSL

## 2014-02-22 ENCOUNTER — Other Ambulatory Visit: Payer: Self-pay | Admitting: Family Medicine

## 2014-02-28 ENCOUNTER — Encounter: Payer: Self-pay | Admitting: Family Medicine

## 2014-02-28 ENCOUNTER — Ambulatory Visit (INDEPENDENT_AMBULATORY_CARE_PROVIDER_SITE_OTHER): Payer: BC Managed Care – PPO | Admitting: Family Medicine

## 2014-02-28 VITALS — BP 130/82 | Ht 63.0 in | Wt 241.6 lb

## 2014-02-28 DIAGNOSIS — E039 Hypothyroidism, unspecified: Secondary | ICD-10-CM

## 2014-02-28 DIAGNOSIS — E119 Type 2 diabetes mellitus without complications: Secondary | ICD-10-CM

## 2014-02-28 DIAGNOSIS — I10 Essential (primary) hypertension: Secondary | ICD-10-CM

## 2014-02-28 DIAGNOSIS — D869 Sarcoidosis, unspecified: Secondary | ICD-10-CM

## 2014-02-28 DIAGNOSIS — E1149 Type 2 diabetes mellitus with other diabetic neurological complication: Secondary | ICD-10-CM

## 2014-02-28 LAB — POCT GLYCOSYLATED HEMOGLOBIN (HGB A1C): Hemoglobin A1C: 5.8

## 2014-02-28 MED ORDER — METFORMIN HCL 500 MG PO TABS: 500.0000 mg | ORAL_TABLET | Freq: Every day | ORAL | Status: DC

## 2014-02-28 MED ORDER — POTASSIUM CHLORIDE CRYS ER 20 MEQ PO TBCR: EXTENDED_RELEASE_TABLET | ORAL | Status: DC

## 2014-02-28 MED ORDER — GLIPIZIDE 5 MG PO TABS: 5.0000 mg | ORAL_TABLET | Freq: Every day | ORAL | Status: DC

## 2014-02-28 MED ORDER — ATENOLOL 50 MG PO TABS: ORAL_TABLET | ORAL | Status: DC

## 2014-02-28 MED ORDER — LEVOTHYROXINE SODIUM 112 MCG PO TABS: ORAL_TABLET | ORAL | Status: DC

## 2014-02-28 MED ORDER — INDAPAMIDE 2.5 MG PO TABS: ORAL_TABLET | ORAL | Status: DC

## 2014-02-28 NOTE — Progress Notes (Signed)
   Subjective:    Patient ID: Amy Hendrix, female    DOB: [birth year], 63 y.o.   MRN: [id number]  Diabetes She presents for her follow-up diabetic visit. She has type 2 diabetes mellitus. Risk factors for coronary artery disease include diabetes mellitus, hypertension and post-menopausal. Current diabetic treatment includes oral agent (dual therapy). She is compliant with treatment all of the time.   Results for orders placed in visit on 02/28/14  POCT GLYCOSYLATED HEMOGLOBIN (HGB A1C)      Result Value Ref Range   Hemoglobin A1C 5.8     Not really ckingher sugars, has not done so for quite some time.  Appetite not the bestsince getting sick. See prior notes.  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 Illness led to worsening the before then breathing was stable.  Still fatigued fr recent para influenza  Exercise not so good, C minus, since getting sick. Was walking regularly before then.  Eye doc no t the best, had cataract surg on left and on the right. No retinal damage from the diabetes.  Sticking with b p meds,no obvious side effects, blood pressure good when checked elsewhere    Review of Systems No headache no chest pain no back pain no abdominal pain no change in bowel habits    Objective:   Physical Exam Alert no acute distress. HEENT normal. Lungs clear. Heart regular in rhythm. Ankles without edema. C. Diabetic foot exam       Assessment & Plan:  Impression 1 type 2 diabetes good control discussed. We'll change to glipizide from glyburide rationale discussed. #2 hypertension good control clinically stable. #3 sarcoidosis also good control. #4 post viral fatigue discussed. Plan diet exercise discussed in encourage. Patient to resume checking of her own sugars. At least weekly. Followup in 6 months. WSL

## 2014-06-25 ENCOUNTER — Telehealth: Payer: Self-pay | Admitting: Family Medicine

## 2014-06-25 NOTE — Telephone Encounter (Signed)
Patient had to switch insurance companies due to her turning 63. Her new insurance does not cover glyburide. She said that she needs this replaced with something else that they may cover.     [employer].

## 2014-06-25 NOTE — Telephone Encounter (Signed)
Patient was notified that she was changed to glipizide a while back. Patient verbalized understanding.

## 2014-06-25 NOTE — Telephone Encounter (Signed)
That's weird switched to glucotrol a while bk, which is covered

## 2014-08-17 ENCOUNTER — Telehealth: Payer: Self-pay | Admitting: Family Medicine

## 2014-08-17 NOTE — Telephone Encounter (Signed)
Patient said that she needs a new diabetic testing kit sent to [facility]

## 2014-08-17 NOTE — Telephone Encounter (Signed)
[facility], Script faxed to pharmacy.

## 2014-08-22 ENCOUNTER — Other Ambulatory Visit: Payer: Self-pay | Admitting: Family Medicine

## 2014-08-22 NOTE — Telephone Encounter (Signed)
Diclofenac last filled 07/2013

## 2014-08-22 NOTE — Telephone Encounter (Signed)
Call pt we'll ref her meds if she comes and sees me within the next mo for chronic ov--be sure to go ahead and sched. Must sched an o v before [place]

## 2014-08-28 ENCOUNTER — Ambulatory Visit (INDEPENDENT_AMBULATORY_CARE_PROVIDER_SITE_OTHER): Payer: Medicare Other | Admitting: Family Medicine

## 2014-08-28 ENCOUNTER — Encounter: Payer: Self-pay | Admitting: Family Medicine

## 2014-08-28 VITALS — BP 132/90 | Ht 63.0 in | Wt 248.0 lb

## 2014-08-28 DIAGNOSIS — E039 Hypothyroidism, unspecified: Secondary | ICD-10-CM

## 2014-08-28 DIAGNOSIS — D869 Sarcoidosis, unspecified: Secondary | ICD-10-CM

## 2014-08-28 DIAGNOSIS — E119 Type 2 diabetes mellitus without complications: Secondary | ICD-10-CM

## 2014-08-28 DIAGNOSIS — Z79899 Other long term (current) drug therapy: Secondary | ICD-10-CM

## 2014-08-28 DIAGNOSIS — I10 Essential (primary) hypertension: Secondary | ICD-10-CM

## 2014-08-28 DIAGNOSIS — E038 Other specified hypothyroidism: Secondary | ICD-10-CM

## 2014-08-28 DIAGNOSIS — E785 Hyperlipidemia, unspecified: Secondary | ICD-10-CM

## 2014-08-28 DIAGNOSIS — Z23 Encounter for immunization: Secondary | ICD-10-CM

## 2014-08-28 LAB — POCT GLYCOSYLATED HEMOGLOBIN (HGB A1C): HEMOGLOBIN A1C: 6.5

## 2014-08-28 LAB — HEPATIC FUNCTION PANEL
ALK PHOS: 63 U/L (ref 39–117)
ALT: 32 U/L (ref 0–35)
AST: 32 U/L (ref 0–37)
Albumin: 4.3 g/dL (ref 3.5–5.2)
BILIRUBIN TOTAL: 0.7 mg/dL (ref 0.2–1.2)
Bilirubin, Direct: 0.1 mg/dL (ref 0.0–0.3)
Indirect Bilirubin: 0.6 mg/dL (ref 0.2–1.2)
TOTAL PROTEIN: 6.8 g/dL (ref 6.0–8.3)

## 2014-08-28 LAB — BASIC METABOLIC PANEL
BUN: 25 mg/dL — ABNORMAL HIGH (ref 6–23)
CO2: 30 meq/L (ref 19–32)
CREATININE: 1.48 mg/dL — AB (ref 0.50–1.10)
Calcium: 9.8 mg/dL (ref 8.4–10.5)
Chloride: 103 mEq/L (ref 96–112)
Glucose, Bld: 124 mg/dL — ABNORMAL HIGH (ref 70–99)
Potassium: 5.1 mEq/L (ref 3.5–5.3)
Sodium: 140 mEq/L (ref 135–145)

## 2014-08-28 LAB — LIPID PANEL
CHOL/HDL RATIO: 5.9 ratio
CHOLESTEROL: 205 mg/dL — AB (ref 0–200)
HDL: 35 mg/dL — AB (ref >39)
LDL Cholesterol: 128 mg/dL — ABNORMAL HIGH (ref 0–99)
TRIGLYCERIDES: 212 mg/dL — AB (ref <150)
VLDL: 42 mg/dL — ABNORMAL HIGH (ref 0–40)

## 2014-08-28 LAB — TSH: TSH: 2.688 u[IU]/mL (ref 0.350–4.500)

## 2014-08-28 MED ORDER — METFORMIN HCL 500 MG PO TABS: 500.0000 mg | ORAL_TABLET | Freq: Every day | ORAL | Status: DC

## 2014-08-28 MED ORDER — LEVOTHYROXINE SODIUM 112 MCG PO TABS: ORAL_TABLET | ORAL | Status: DC

## 2014-08-28 MED ORDER — ATENOLOL 50 MG PO TABS: 50.0000 mg | ORAL_TABLET | Freq: Every day | ORAL | Status: DC

## 2014-08-28 MED ORDER — POTASSIUM CHLORIDE CRYS ER 20 MEQ PO TBCR: EXTENDED_RELEASE_TABLET | ORAL | Status: DC

## 2014-08-28 MED ORDER — INDAPAMIDE 2.5 MG PO TABS: 2.5000 mg | ORAL_TABLET | Freq: Every day | ORAL | Status: DC

## 2014-08-28 MED ORDER — GLIPIZIDE 5 MG PO TABS: 5.0000 mg | ORAL_TABLET | Freq: Every day | ORAL | Status: DC

## 2014-08-28 NOTE — Progress Notes (Signed)
   Subjective:    Patient ID: Amy Hendrix, female    DOB: 11/30/1950, 63 y.o.   MRN: [id number]  Diabetes She presents for her follow-up diabetic visit. She has type 2 diabetes mellitus. There are no hypoglycemic associated symptoms. There are no diabetic associated symptoms. Current diabetic treatment includes oral agent (dual therapy). She is compliant with treatment all of the time. She rarely participates in exercise. She monitors blood glucose at home 3-4 x per week. Her overall blood glucose range is 140-180 mg/dl. She does not see a podiatrist.Eye exam current: cataract surgery in March.   Got a new monitor and chiecking glucose  Results for orders placed or performed in visit on 08/28/14  POCT glycosylated hemoglobin (Hb A1C)  Result Value Ref Range   Hemoglobin A1C 6.5    Last eye doc visit in march cataract surg  Vision still not the best  Had a film grow over the lens nd needed removal  Exercise not the best  bp meds stable. No obvious side effects. Trying to watch salt intake.  No increased shortness of breath from the sarcoidosis. No wheezing.  Compliant with hypothyroidism medicine. No obvious side effects. No symptoms of high or low thyroid.   Review of Systems No headache no chest pain no back pain no abdominal pain no change in bowel habits     Objective:   Physical Exam Alert obesity present. Good blood pressure. HEENT normal. Lungs clear heart regular in rhythm. Ankles without edema. C diabetic foot exam.      Assessment & Plan:   impression #1 type 2 diabetes good control. #2 hypertension good control. #3 hypothyroidism status uncertain. #4 hyperlipidemia status uncertain. #5 sarcoidosis ongoing discuss no acute symptoms. Patient wishes not to see specialists at this time. Plan diet exercise discussed. Flu vaccine given. Appropriate blood work. Further recommendations based on results. WSL

## 2014-08-29 ENCOUNTER — Encounter: Payer: Self-pay | Admitting: Family Medicine

## 2014-08-29 LAB — MICROALBUMIN, URINE: MICROALB UR: 0.8 mg/dL (ref <2.0)

## 2014-11-21 ENCOUNTER — Other Ambulatory Visit: Payer: Self-pay | Admitting: Family Medicine

## 2015-02-20 ENCOUNTER — Other Ambulatory Visit: Payer: Self-pay | Admitting: Family Medicine

## 2015-02-27 ENCOUNTER — Encounter: Payer: Self-pay | Admitting: Family Medicine

## 2015-02-27 ENCOUNTER — Ambulatory Visit (INDEPENDENT_AMBULATORY_CARE_PROVIDER_SITE_OTHER): Payer: Medicare Other | Admitting: Family Medicine

## 2015-02-27 VITALS — BP 120/82 | Ht 63.0 in | Wt 246.0 lb

## 2015-02-27 DIAGNOSIS — E119 Type 2 diabetes mellitus without complications: Secondary | ICD-10-CM | POA: Diagnosis not present

## 2015-02-27 DIAGNOSIS — I10 Essential (primary) hypertension: Secondary | ICD-10-CM | POA: Diagnosis not present

## 2015-02-27 DIAGNOSIS — Z79899 Other long term (current) drug therapy: Secondary | ICD-10-CM

## 2015-02-27 DIAGNOSIS — D869 Sarcoidosis, unspecified: Secondary | ICD-10-CM

## 2015-02-27 LAB — POCT GLYCOSYLATED HEMOGLOBIN (HGB A1C): Hemoglobin A1C: 6.9

## 2015-02-27 NOTE — Progress Notes (Signed)
   Subjective:    Patient ID: Amy Hendrix, femalManson Hendrix    DOB: 05/01/1951, 64 y.o.   MRN: [id number]  Diabetes She presents for her follow-up diabetic visit. She has type 2 diabetes mellitus. There are no hypoglycemic associated symptoms. There are no diabetic associated symptoms. There are no hypoglycemic complications. There are no diabetic complications. There are no known risk factors for coronary artery disease. Current diabetic treatment includes oral agent (dual therapy). She is compliant with treatment all of the time.   Patient states that she wants to discuss continuing to take the diclofenac.  Not cking sugars these days  Not exercising much these days  Took the diclofenac fairly regularly. Wonders if she can take twice per day.  No significant shortness of breath or wheezing. Not exercising much these days.  Ongoing challenges with joint pain. Often in the feet and back.  Compliant blood pressure medicine. Watching salt intake. Blood pressures good when checked elsewhere  Taking one daily now    Results for orders placed or performed in visit on 02/27/15  POCT glycosylated hemoglobin (Hb A1C)  Result Value Ref Range   Hemoglobin A1C 6.9      Review of Systems No headache no chest pain no back pain no abdominal pain no change in bowel habits    Objective:   Physical Exam  Alert vitals stable no acute distress H&T normal neck supple lungs clear heart regular in rhythm ankles without edema      Assessment & Plan:  Impression #1 type 2 diabetes good control #2 sarcoidosis clinically stable at this time. #3 renal insufficiency discussed #4 arthritis discuss need to see what creatinine shows before Amy Hendrix and on daily anti-inflammatory disease #5 hypertension good control #6 hyperlipidemia status uncertain plan appropriate blood work. Further recommendations based on results. Diet exercise discussed. WSL

## 2015-02-28 LAB — BASIC METABOLIC PANEL
BUN/Creatinine Ratio: 16 (ref 11–26)
BUN: 23 mg/dL (ref 8–27)
CALCIUM: 9.8 mg/dL (ref 8.7–10.3)
CO2: 27 mmol/L (ref 18–29)
Chloride: 98 mmol/L (ref 97–108)
Creatinine, Ser: 1.43 mg/dL — ABNORMAL HIGH (ref 0.57–1.00)
GFR calc Af Amer: 44 mL/min/{1.73_m2} — ABNORMAL LOW (ref >59)
GFR calc non Af Amer: 38 mL/min/{1.73_m2} — ABNORMAL LOW (ref >59)
GLUCOSE: 151 mg/dL — AB (ref 65–99)
POTASSIUM: 4.7 mmol/L (ref 3.5–5.2)
Sodium: 139 mmol/L (ref 134–144)

## 2015-02-28 LAB — LIPID PANEL
CHOL/HDL RATIO: 6.5 ratio — AB (ref 0.0–4.4)
Cholesterol, Total: 222 mg/dL — ABNORMAL HIGH (ref 100–199)
HDL: 34 mg/dL — ABNORMAL LOW (ref >39)
LDL CALC: 141 mg/dL — AB (ref 0–99)
TRIGLYCERIDES: 237 mg/dL — AB (ref 0–149)
VLDL Cholesterol Cal: 47 mg/dL — ABNORMAL HIGH (ref 5–40)

## 2015-03-01 ENCOUNTER — Other Ambulatory Visit: Payer: Self-pay

## 2015-03-01 MED ORDER — PRAVASTATIN SODIUM 20 MG PO TABS: 20.0000 mg | ORAL_TABLET | Freq: Every day | ORAL | Status: DC

## 2015-03-05 ENCOUNTER — Telehealth: Payer: Self-pay | Admitting: Family Medicine

## 2015-03-05 ENCOUNTER — Other Ambulatory Visit: Payer: Self-pay | Admitting: *Deleted

## 2015-03-05 MED ORDER — TRAMADOL HCL 50 MG PO TABS: 50.0000 mg | ORAL_TABLET | Freq: Four times a day (QID) | ORAL | Status: DC | PRN

## 2015-03-05 NOTE — Telephone Encounter (Signed)
Tramadol 50 numb forty one q 6 hr prn pain, two ref

## 2015-03-05 NOTE — Telephone Encounter (Signed)
Pt called stating that the tylenol is not strong enough for her pain and wants to know if she can be prescribed something stronger.  Amy Hendrix

## 2015-03-05 NOTE — Telephone Encounter (Signed)
Pt seen 6/1 and was taken off of diclofenac because of her kidney function. She is taking extra strength tylenol and it is not touching the pain. Pt states there have been times when she cannot walk because of the pain and she is trying to get pain under control before it reaches that point again. She is requesting something a little stronger.

## 2015-03-05 NOTE — Telephone Encounter (Signed)
Med faxed to pharm. Pt notified.  

## 2015-03-28 ENCOUNTER — Other Ambulatory Visit: Payer: Self-pay | Admitting: Family Medicine

## 2015-05-06 ENCOUNTER — Other Ambulatory Visit: Payer: Self-pay | Admitting: *Deleted

## 2015-05-06 MED ORDER — PRAVASTATIN SODIUM 20 MG PO TABS: 20.0000 mg | ORAL_TABLET | Freq: Every day | ORAL | Status: DC

## 2015-05-22 ENCOUNTER — Other Ambulatory Visit: Payer: Self-pay | Admitting: Family Medicine

## 2015-08-07 ENCOUNTER — Other Ambulatory Visit: Payer: Self-pay | Admitting: Family Medicine

## 2015-08-21 ENCOUNTER — Other Ambulatory Visit: Payer: Self-pay | Admitting: Family Medicine

## 2015-08-21 ENCOUNTER — Telehealth: Payer: Self-pay | Admitting: Family Medicine

## 2015-08-21 NOTE — Telephone Encounter (Signed)
[facility] requests for 5 of the patients' medication.  Amy Hendrix is requesting this be done ASAP and please call him when done as he doesn't want her to go without her medication.

## 2015-08-21 NOTE — Telephone Encounter (Signed)
Done

## 2015-08-28 ENCOUNTER — Encounter: Payer: Medicare Other | Admitting: Family Medicine

## 2015-08-29 ENCOUNTER — Telehealth: Payer: Self-pay | Admitting: Family Medicine

## 2015-08-29 NOTE — Telephone Encounter (Signed)
Pt is wanting to know if the hydrocodone cough syrup can be called [facility]. Pt states that she has a terrible cough that she believes that she caught in our office last week.

## 2015-08-29 NOTE — Telephone Encounter (Signed)
[facility] to get more info 

## 2015-08-30 MED ORDER — HYDROCODONE-HOMATROPINE 5-1.5 MG/5ML PO SYRP
ORAL_SOLUTION | ORAL | Status: DC
Start: 2015-08-30 — End: 2015-09-25

## 2015-08-30 NOTE — Telephone Encounter (Signed)
Called and spoke with patient and informed her per Dr.Scott Luking-The patient may have a prescription for the hydrocodone cough medicine, 4 ounces 1 teaspoon every 6 when necessary cough home use only also if patient feels her illness is progressing be on a common virus highly recommended to be seen. If her illness continues into next week once again highly recommended to be seen. Patient verbalized understanding.

## 2015-08-30 NOTE — Telephone Encounter (Signed)
Patient states that she has a dry, hacky cough that is very bad. Unable to sleep at night and it is worse in the mornings. Slight congestion noted. No other symptoms.

## 2015-08-30 NOTE — Telephone Encounter (Signed)
The patient may have a prescription for the hydrocodone cough medicine, 4 ounces 1 teaspoon every 6 when necessary cough home use only also if patient feels her illness is progressing be on a common virus highly recommended to be seen. If her illness continues into next week once again highly recommended to be seen

## 2015-09-03 ENCOUNTER — Ambulatory Visit: Payer: BC Managed Care – PPO | Admitting: Family Medicine

## 2015-09-25 ENCOUNTER — Encounter: Payer: Self-pay | Admitting: Family Medicine

## 2015-09-25 ENCOUNTER — Ambulatory Visit (INDEPENDENT_AMBULATORY_CARE_PROVIDER_SITE_OTHER): Payer: Medicare Other | Admitting: Family Medicine

## 2015-09-25 VITALS — BP 128/82 | Ht 63.0 in | Wt 236.8 lb

## 2015-09-25 DIAGNOSIS — E785 Hyperlipidemia, unspecified: Secondary | ICD-10-CM

## 2015-09-25 DIAGNOSIS — I10 Essential (primary) hypertension: Secondary | ICD-10-CM | POA: Diagnosis not present

## 2015-09-25 DIAGNOSIS — Z79899 Other long term (current) drug therapy: Secondary | ICD-10-CM | POA: Diagnosis not present

## 2015-09-25 DIAGNOSIS — Z0000 Encounter for general adult medical examination without abnormal findings: Secondary | ICD-10-CM | POA: Diagnosis not present

## 2015-09-25 DIAGNOSIS — Z23 Encounter for immunization: Secondary | ICD-10-CM

## 2015-09-25 DIAGNOSIS — E119 Type 2 diabetes mellitus without complications: Secondary | ICD-10-CM | POA: Diagnosis not present

## 2015-09-25 LAB — POCT GLYCOSYLATED HEMOGLOBIN (HGB A1C): Hemoglobin A1C: 6.9

## 2015-09-25 MED ORDER — POTASSIUM CHLORIDE CRYS ER 20 MEQ PO TBCR: EXTENDED_RELEASE_TABLET | ORAL | Status: DC

## 2015-09-25 MED ORDER — LEVOTHYROXINE SODIUM 112 MCG PO TABS: ORAL_TABLET | ORAL | Status: DC

## 2015-09-25 MED ORDER — ATENOLOL 50 MG PO TABS: 50.0000 mg | ORAL_TABLET | Freq: Every day | ORAL | Status: DC

## 2015-09-25 MED ORDER — PRAVASTATIN SODIUM 20 MG PO TABS: 20.0000 mg | ORAL_TABLET | Freq: Every day | ORAL | Status: DC

## 2015-09-25 MED ORDER — GLIPIZIDE 5 MG PO TABS: ORAL_TABLET | ORAL | Status: DC

## 2015-09-25 MED ORDER — INDAPAMIDE 2.5 MG PO TABS: 2.5000 mg | ORAL_TABLET | Freq: Every day | ORAL | Status: DC

## 2015-09-25 MED ORDER — METFORMIN HCL 500 MG PO TABS: ORAL_TABLET | ORAL | Status: DC

## 2015-09-25 NOTE — Progress Notes (Signed)
Subjective:    Patient ID: Amy Hendrix, female    DOB: 06/09/1951, 64 y.o.   MRN: [id number]  HPI  AWV- Annual Wellness Visit  The patient was seen for their annual wellness visit. The patient's past medical history, surgical history, and family history were reviewed. Pertinent vaccines were reviewed ( tetanus, pneumonia, shingles, flu) The patient's medication list was reviewed and updated.  The height and weight were entered. The patient's current BMI is:41.95  Cognitive screening was completed. Outcome of Mini - Cog: pass  Falls within the past 6 months:none  Current tobacco usage: no (All patients who use tobacco were given written and verbal information on quitting)  Recent listing of emergency department/hospitalizations over the past year were reviewed.  current specialist the patient sees on a regular basis: no  Flu shot due and pneum shot due  Medicare annual wellness visit patient questionnaire was reviewed.  A written screening schedule for the patient for the next 5-10 years was given. Appropriate discussion of followup regarding next visit was discussed.  Last mammo two yrs ago  Pt has not had colonoscopy  Sugars doing well, Following diet as best as she can   No eye doc within the past yr    Results for orders placed or performed in visit on 09/25/15  POCT glycosylated hemoglobin (Hb A1C)  Result Value Ref Range   Hemoglobin A1C 6.9     Patient compliant with diabetes medicine. Reports no low sugar spells. Generally does not miss a dose. No obvious side effects. Meds reviewed today. Sugars generally 110 or so nature. Next  Compliant with blood pressure medicine. No obvious side effects. Meds reviewed today. Watching salt intake.   Review of Systems  Constitutional: Negative for activity change, appetite change and fatigue.  HENT: Negative for congestion, ear discharge and rhinorrhea.   Eyes: Negative for discharge.  Respiratory: Negative for  cough, chest tightness and wheezing.   Cardiovascular: Negative for chest pain.  Gastrointestinal: Negative for vomiting and abdominal pain.  Genitourinary: Negative for frequency and difficulty urinating.  Musculoskeletal: Negative for neck pain.  Allergic/Immunologic: Negative for environmental allergies and food allergies.  Neurological: Negative for weakness and headaches.  Psychiatric/Behavioral: Negative for behavioral problems and agitation.  All other systems reviewed and are negative.      Objective:   Physical Exam  Constitutional: She is oriented to person, place, and time. She appears well-developed and well-nourished.  HENT:  Head: Normocephalic.  Right Ear: External ear normal.  Left Ear: External ear normal.  Eyes: Pupils are equal, round, and reactive to light.  Neck: Normal range of motion. No thyromegaly present.  Cardiovascular: Normal rate, regular rhythm, normal heart sounds and intact distal pulses.   No murmur heard. Pulmonary/Chest: Effort normal and breath sounds normal. No respiratory distress. She has no wheezes.  Abdominal: Soft. Bowel sounds are normal. She exhibits no distension and no mass. There is no tenderness.  Musculoskeletal: Normal range of motion. She exhibits no edema or tenderness.  Lymphadenopathy:    She has no cervical adenopathy.  Neurological: She is alert and oriented to person, place, and time. She exhibits normal muscle tone.  Skin: Skin is warm and dry.  Psychiatric: She has a normal mood and affect. Her behavior is normal.  Vitals reviewed.         Assessment & Plan:  Impression 1 well until exam. Patient Amy Hendrix on numerous screening test. 3 years since last mammogram. Never had a colonoscopy. #2 type 2  diabetes good control discussed maintain same meds #2 hypertension good control discussed maintain same meds plan colonoscopy sheet given patient to schedule. Patient to schedule mammogram. Diet exercise discussed. Flu shot  pneumonia shot today. Appropriate blood work [employer]

## 2015-09-26 LAB — LIPID PANEL
CHOL/HDL RATIO: 5.7 ratio — AB (ref 0.0–4.4)
Cholesterol, Total: 212 mg/dL — ABNORMAL HIGH (ref 100–199)
HDL: 37 mg/dL — AB (ref >39)
LDL Calculated: 130 mg/dL — ABNORMAL HIGH (ref 0–99)
TRIGLYCERIDES: 226 mg/dL — AB (ref 0–149)
VLDL CHOLESTEROL CAL: 45 mg/dL — AB (ref 5–40)

## 2015-09-26 LAB — BASIC METABOLIC PANEL
BUN / CREAT RATIO: 13 (ref 11–26)
BUN: 15 mg/dL (ref 8–27)
CHLORIDE: 102 mmol/L (ref 96–106)
CO2: 28 mmol/L (ref 18–29)
Calcium: 9.6 mg/dL (ref 8.7–10.3)
Creatinine, Ser: 1.17 mg/dL — ABNORMAL HIGH (ref 0.57–1.00)
GFR calc Af Amer: 56 mL/min/{1.73_m2} — ABNORMAL LOW (ref >59)
GFR calc non Af Amer: 49 mL/min/{1.73_m2} — ABNORMAL LOW (ref >59)
GLUCOSE: 132 mg/dL — AB (ref 65–99)
Potassium: 4.7 mmol/L (ref 3.5–5.2)
SODIUM: 143 mmol/L (ref 134–144)

## 2015-09-26 LAB — HEPATIC FUNCTION PANEL
ALT: 26 IU/L (ref 0–32)
AST: 44 IU/L — ABNORMAL HIGH (ref 0–40)
Albumin: 4.4 g/dL (ref 3.6–4.8)
Alkaline Phosphatase: 57 IU/L (ref 39–117)
BILIRUBIN, DIRECT: 0.19 mg/dL (ref 0.00–0.40)
Bilirubin Total: 0.7 mg/dL (ref 0.0–1.2)
Total Protein: 6.4 g/dL (ref 6.0–8.5)

## 2015-09-26 LAB — MICROALBUMIN / CREATININE URINE RATIO
CREATININE, UR: 124.9 mg/dL
MICROALB/CREAT RATIO: 3 mg/g creat (ref 0.0–30.0)
Microalbumin, Urine: 3.8 ug/mL

## 2015-10-01 ENCOUNTER — Other Ambulatory Visit: Payer: Self-pay | Admitting: Family Medicine

## 2015-10-01 MED ORDER — PRAVASTATIN SODIUM 40 MG PO TABS: 40.0000 mg | ORAL_TABLET | Freq: Every day | ORAL | Status: DC

## 2015-10-01 NOTE — Addendum Note (Signed)
Addended by: Margaretha SheffieldBROWN, Jermany Rimel S on: 10/01/2015 08:37 AM   Modules accepted: Orders

## 2015-11-22 ENCOUNTER — Telehealth: Payer: Self-pay | Admitting: Family Medicine

## 2015-11-22 MED ORDER — HYDROCODONE-ACETAMINOPHEN 5-325 MG PO TABS: 1.0000 | ORAL_TABLET | Freq: Four times a day (QID) | ORAL | Status: DC | PRN

## 2015-11-22 NOTE — Telephone Encounter (Signed)
Does pt want narcotic pain pill like hydrocodone, nonnarcotic pain pill like tramadol, andtiinlam prescrip strength like voltaren?

## 2015-11-22 NOTE — Telephone Encounter (Signed)
Pt wants hydrocodone

## 2015-11-22 NOTE — Telephone Encounter (Signed)
Pt called stating that her arthritis has flared up and she is in a great deal of pain. Pt is requesting a pain medication to be called in if possible.       [facility]

## 2015-11-22 NOTE — Telephone Encounter (Signed)
Discussed with pt. Pt wants hydrocodone. 5.325 one q 6 prn prn #30 per dr Brett Canales. Pt notified script ready for pickup.

## 2015-11-22 NOTE — Telephone Encounter (Signed)
Would like to get today

## 2015-11-22 NOTE — Telephone Encounter (Signed)
[facility] 11/22/15

## 2016-03-10 ENCOUNTER — Ambulatory Visit: Payer: Medicare Other | Admitting: Family Medicine

## 2016-03-25 ENCOUNTER — Encounter: Payer: Self-pay | Admitting: Family Medicine

## 2016-03-25 ENCOUNTER — Ambulatory Visit (INDEPENDENT_AMBULATORY_CARE_PROVIDER_SITE_OTHER): Payer: Medicare Other | Admitting: Family Medicine

## 2016-03-25 VITALS — BP 132/82 | Ht 63.0 in | Wt 231.6 lb

## 2016-03-25 DIAGNOSIS — E039 Hypothyroidism, unspecified: Secondary | ICD-10-CM

## 2016-03-25 DIAGNOSIS — E785 Hyperlipidemia, unspecified: Secondary | ICD-10-CM

## 2016-03-25 DIAGNOSIS — E119 Type 2 diabetes mellitus without complications: Secondary | ICD-10-CM

## 2016-03-25 DIAGNOSIS — I10 Essential (primary) hypertension: Secondary | ICD-10-CM | POA: Diagnosis not present

## 2016-03-25 DIAGNOSIS — D869 Sarcoidosis, unspecified: Secondary | ICD-10-CM

## 2016-03-25 LAB — POCT GLYCOSYLATED HEMOGLOBIN (HGB A1C): Hemoglobin A1C: 7.4

## 2016-03-25 MED ORDER — METFORMIN HCL 500 MG PO TABS: 500.0000 mg | ORAL_TABLET | Freq: Two times a day (BID) | ORAL | Status: DC

## 2016-03-25 MED ORDER — ATENOLOL 50 MG PO TABS: 50.0000 mg | ORAL_TABLET | Freq: Every day | ORAL | Status: DC

## 2016-03-25 MED ORDER — INDAPAMIDE 2.5 MG PO TABS
2.5000 mg | ORAL_TABLET | Freq: Every day | ORAL | Status: DC
Start: 2016-03-25 — End: 2016-09-30

## 2016-03-25 MED ORDER — GENTAMICIN SULFATE 0.3 % OP SOLN: 2.0000 [drp] | Freq: Four times a day (QID) | OPHTHALMIC | Status: AC

## 2016-03-25 MED ORDER — POTASSIUM CHLORIDE CRYS ER 20 MEQ PO TBCR: EXTENDED_RELEASE_TABLET | ORAL | Status: DC

## 2016-03-25 MED ORDER — GLIPIZIDE 5 MG PO TABS: ORAL_TABLET | ORAL | Status: DC

## 2016-03-25 MED ORDER — LEVOTHYROXINE SODIUM 112 MCG PO TABS: ORAL_TABLET | ORAL | Status: DC

## 2016-03-25 MED ORDER — PRAVASTATIN SODIUM 40 MG PO TABS: 40.0000 mg | ORAL_TABLET | Freq: Every day | ORAL | Status: DC

## 2016-03-25 NOTE — Progress Notes (Signed)
   Subjective:    Patient ID: Amy Hendrix, female    DOB: 01/25/1951, 65 y.o.   MRN: [id number] Patient arrives office for follow-up of numerous concerns Diabetes She presents for her follow-up diabetic visit. She has type 2 diabetes mellitus. Risk factors for coronary artery disease include diabetes mellitus, dyslipidemia and hypertension. Current diabetic treatment includes oral agent (dual therapy). She is compliant with treatment all of the time. Her weight is stable. She is following a diabetic diet. She has not had a previous visit with a dietitian. She does not see a podiatrist.Eye exam is not current.   Overall sugars not checking  Results for orders placed or performed in visit on 03/25/16  POCT glycosylated hemoglobin (Hb A1C)  Result Value Ref Range   Hemoglobin A1C 7.4   breathing nt good on bad air days  Not getting colonoscopy yet  Patient claims compliance with thyroid medication. No symptoms of high or low thyroid. Does not miss a dose. Next  Notes compliance with lipid medicine. Prior blood work reviewed with patient. Does not miss a dose no obvious side effects.  Patient has stye coming in her left eye , irritating in nature Review of Systems No headache, no major weight loss or weight gain, no chest pain no back pain abdominal pain no change in bowel habits complete ROS otherwise negative     Objective:   Physical Exam Alert vitals stable. HEENT left eye stye evident pharynx normal obesity present. Lungs clear. Heart rare rhythm ankles without edema       Assessment & Plan:  Impression 1 type 2 diabetes suboptimum control discussed #2 sarcoidosis clinically stable #3 hypertension good control compliant on meds #4 stye discussed #5 hyperlipidemia. Status uncertain discuss plan appropriate blood work. Medications refilled. Diet exercise discussed strongly encouraged to get on with colonoscopy. Also check TSH for thyroid

## 2016-03-26 LAB — LIPID PANEL
CHOL/HDL RATIO: 6.3 ratio — AB (ref 0.0–4.4)
Cholesterol, Total: 215 mg/dL — ABNORMAL HIGH (ref 100–199)
HDL: 34 mg/dL — ABNORMAL LOW (ref >39)
LDL CALC: 131 mg/dL — AB (ref 0–99)
TRIGLYCERIDES: 252 mg/dL — AB (ref 0–149)
VLDL Cholesterol Cal: 50 mg/dL — ABNORMAL HIGH (ref 5–40)

## 2016-03-26 LAB — HEPATIC FUNCTION PANEL
ALT: 27 IU/L (ref 0–32)
AST: 34 IU/L (ref 0–40)
Albumin: 4.5 g/dL (ref 3.6–4.8)
Alkaline Phosphatase: 60 IU/L (ref 39–117)
BILIRUBIN TOTAL: 0.7 mg/dL (ref 0.0–1.2)
BILIRUBIN, DIRECT: 0.22 mg/dL (ref 0.00–0.40)
TOTAL PROTEIN: 6.7 g/dL (ref 6.0–8.5)

## 2016-03-26 LAB — TSH: TSH: 3.3 u[IU]/mL (ref 0.450–4.500)

## 2016-03-30 ENCOUNTER — Other Ambulatory Visit: Payer: Self-pay | Admitting: *Deleted

## 2016-03-30 MED ORDER — ATORVASTATIN CALCIUM 40 MG PO TABS: 40.0000 mg | ORAL_TABLET | Freq: Every day | ORAL | Status: DC

## 2016-05-27 ENCOUNTER — Other Ambulatory Visit: Payer: Self-pay | Admitting: Family Medicine

## 2016-06-08 ENCOUNTER — Encounter: Payer: Self-pay | Admitting: Family Medicine

## 2016-06-08 ENCOUNTER — Ambulatory Visit (INDEPENDENT_AMBULATORY_CARE_PROVIDER_SITE_OTHER): Payer: Medicare Other | Admitting: Family Medicine

## 2016-06-08 VITALS — BP 116/74 | Ht 63.0 in | Wt 230.0 lb

## 2016-06-08 DIAGNOSIS — Z23 Encounter for immunization: Secondary | ICD-10-CM | POA: Diagnosis not present

## 2016-06-08 DIAGNOSIS — F329 Major depressive disorder, single episode, unspecified: Secondary | ICD-10-CM

## 2016-06-08 DIAGNOSIS — F32A Depression, unspecified: Secondary | ICD-10-CM

## 2016-06-08 MED ORDER — ESCITALOPRAM OXALATE 10 MG PO TABS: 10.0000 mg | ORAL_TABLET | Freq: Every day | ORAL | 2 refills | Status: DC

## 2016-06-08 NOTE — Progress Notes (Signed)
   Subjective:    Patient ID: Amy Hendrix, female    DOB: 04/15/1951, 65 y.o.   MRN: [id number] Patient presents with very long discussion patient presents Depression         This is a new problem.  The current episode started more than 1 month ago.   Associated symptoms include insomnia.     The symptoms are aggravated by family issues (Loss of parent).   Patient has lost numerous close relatives in the past. She never recalls being quite this devastated. Her mother passed away 2 months ago from complications of dementia and a hip fracture. She and her mother were very close. She has been involved closely in caretaking. Next  Having frequent crying spells throughout the day. Diminished energy. Diminished appetite. Difficulty sleeping.  No suicidal thoughts.  Feels down and depressed.  Has a grandchild coming on once to be a strong as possible and have his good mood is possible when this new child comes in her life next  Has never had to take antidepressants before but would like to consider them Patient states no other concerns this visit.     Review of Systems  Psychiatric/Behavioral: Positive for depression. The patient has insomnia.   No headache, no major weight loss or weight gain, no chest pain no back pain abdominal pain no change in bowel habits complete ROS otherwise negative      Objective:   Physical Exam  Alert vitals stable, NAD. Blood pressure good on repeat. HEENT normal. Lungs clear. Heart regular rate and rhythm.       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 Everyone grieves at around rate. Patient is ready to start feeling better. We discussed medicine would help some but not completely. Patient would like to try medicine side effects benefits discussed plan initiate Lexapro 10 every morning refills written. Take for a least 3 months. Strongly encouraged exercise. In talking it over with her loved ones 25 minutes  spent most in discussion

## 2016-07-07 ENCOUNTER — Other Ambulatory Visit: Payer: Self-pay | Admitting: Family Medicine

## 2016-09-29 ENCOUNTER — Other Ambulatory Visit: Payer: Self-pay | Admitting: Family Medicine

## 2016-09-30 ENCOUNTER — Ambulatory Visit (INDEPENDENT_AMBULATORY_CARE_PROVIDER_SITE_OTHER): Payer: Medicare Other | Admitting: Family Medicine

## 2016-09-30 ENCOUNTER — Encounter: Payer: Self-pay | Admitting: Family Medicine

## 2016-09-30 VITALS — BP 138/86 | Ht 63.0 in | Wt 220.0 lb

## 2016-09-30 DIAGNOSIS — F321 Major depressive disorder, single episode, moderate: Secondary | ICD-10-CM | POA: Diagnosis not present

## 2016-09-30 DIAGNOSIS — D869 Sarcoidosis, unspecified: Secondary | ICD-10-CM | POA: Diagnosis not present

## 2016-09-30 DIAGNOSIS — E785 Hyperlipidemia, unspecified: Secondary | ICD-10-CM | POA: Diagnosis not present

## 2016-09-30 DIAGNOSIS — E119 Type 2 diabetes mellitus without complications: Secondary | ICD-10-CM | POA: Diagnosis not present

## 2016-09-30 DIAGNOSIS — I10 Essential (primary) hypertension: Secondary | ICD-10-CM

## 2016-09-30 DIAGNOSIS — Z79899 Other long term (current) drug therapy: Secondary | ICD-10-CM | POA: Diagnosis not present

## 2016-09-30 LAB — POCT GLYCOSYLATED HEMOGLOBIN (HGB A1C): Hemoglobin A1C: 5.1

## 2016-09-30 MED ORDER — ATENOLOL 50 MG PO TABS: 50.0000 mg | ORAL_TABLET | Freq: Every day | ORAL | 1 refills | Status: DC

## 2016-09-30 MED ORDER — ATORVASTATIN CALCIUM 40 MG PO TABS: 40.0000 mg | ORAL_TABLET | Freq: Every day | ORAL | 1 refills | Status: DC

## 2016-09-30 MED ORDER — POTASSIUM CHLORIDE CRYS ER 20 MEQ PO TBCR: EXTENDED_RELEASE_TABLET | ORAL | 1 refills | Status: DC

## 2016-09-30 MED ORDER — LEVOTHYROXINE SODIUM 112 MCG PO TABS: ORAL_TABLET | ORAL | 1 refills | Status: DC

## 2016-09-30 MED ORDER — ESCITALOPRAM OXALATE 10 MG PO TABS: 10.0000 mg | ORAL_TABLET | Freq: Every day | ORAL | 1 refills | Status: DC

## 2016-09-30 MED ORDER — METFORMIN HCL 500 MG PO TABS: 500.0000 mg | ORAL_TABLET | Freq: Two times a day (BID) | ORAL | 1 refills | Status: DC

## 2016-09-30 MED ORDER — INDAPAMIDE 2.5 MG PO TABS: 2.5000 mg | ORAL_TABLET | Freq: Every day | ORAL | 1 refills | Status: DC

## 2016-09-30 NOTE — Progress Notes (Signed)
   Subjective:    Patient ID: Amy Hendrix, female    DOB: 05/01/1951, 66 y.o.   MRN: [id number]  Diabetes  She presents for her follow-up diabetic visit. She has type 2 diabetes mellitus. Hypoglycemia symptoms include dizziness. Current diabetic treatments: metformin, glipizide. She is compliant with treatment all of the time. She is following a generally unhealthy diet. She never participates in exercise. Home blood sugar record trend: does not check blood sugar. She does not see a podiatrist.Eye exam is not current.   A1C 5.1 today.  dizziness since November.   Patient notes ongoing compliance with antidepressant medication. No obvious side effects. Reports does not miss a dose. Overall continues to help depression substantially. No thoughts of homicide or suicide. Would like to maintain medication.  Blood pressure medicine and blood pressure levels reviewed today with patient. Compliant with blood pressure medicine. States does not miss a dose. No obvious side effects. Blood pressure generally good when checked elsewhere. Watching salt intake.  Exercise not much, pt does not geernally  Dizziness lasts momentarily, notes a visiual component. Last seconds, eityher sits down, passes soon, some spiinning  Hits with change of position, can occur with head or body motion    Patient continues to take lipid medication regularly. No obvious side effects from it. Generally does not miss a dose. Prior blood work results are reviewed with patient. Patient continues to work on fat intake in diet  Review of Systems  Neurological: Positive for dizziness.  Alert vitals stable, NAD. Blood pressure good on repeat. HEENT normal. Lungs clear. Heart regular rate and rhythm.  No headache, no major weight loss or weight gain, no chest pain no back pain abdominal pain no change in bowel habits complete ROS otherwise negative     Objective:   Physical Exam Alert vitals stable, NAD. Blood pressure good  on repeat. HEENT normal. Lungs clear. Heart regular rate and rhythm. Ankles without edema C diabetic foot exam       Assessment & Plan:  Impression 1 hypertension good control discussed maintain same meds #2 depression good control discussed maintain same #3 hyperlipidemia prior blood work reviewed to maintain same diet compliance discussed #4 type 2 diabetes controlled to tight discuss medications adjusted. #6 sarcoidosis clinically stable at this time. Follow-up in 6 months

## 2016-10-08 LAB — BASIC METABOLIC PANEL
BUN/Creatinine Ratio: 17 (ref 12–28)
BUN: 20 mg/dL (ref 8–27)
CALCIUM: 9.8 mg/dL (ref 8.7–10.3)
CHLORIDE: 99 mmol/L (ref 96–106)
CO2: 25 mmol/L (ref 18–29)
Creatinine, Ser: 1.17 mg/dL — ABNORMAL HIGH (ref 0.57–1.00)
GFR calc Af Amer: 56 mL/min/{1.73_m2} — ABNORMAL LOW (ref >59)
GFR calc non Af Amer: 48 mL/min/{1.73_m2} — ABNORMAL LOW (ref >59)
Glucose: 155 mg/dL — ABNORMAL HIGH (ref 65–99)
Potassium: 4.5 mmol/L (ref 3.5–5.2)
Sodium: 142 mmol/L (ref 134–144)

## 2016-10-08 LAB — HEPATIC FUNCTION PANEL
ALT: 19 IU/L (ref 0–32)
AST: 24 IU/L (ref 0–40)
Albumin: 4.3 g/dL (ref 3.6–4.8)
Alkaline Phosphatase: 54 IU/L (ref 39–117)
BILIRUBIN TOTAL: 0.6 mg/dL (ref 0.0–1.2)
Bilirubin, Direct: 0.18 mg/dL (ref 0.00–0.40)
TOTAL PROTEIN: 6.6 g/dL (ref 6.0–8.5)

## 2016-10-08 LAB — LIPID PANEL
CHOL/HDL RATIO: 4.6 ratio — AB (ref 0.0–4.4)
Cholesterol, Total: 162 mg/dL (ref 100–199)
HDL: 35 mg/dL — ABNORMAL LOW (ref >39)
LDL CALC: 80 mg/dL (ref 0–99)
TRIGLYCERIDES: 234 mg/dL — AB (ref 0–149)
VLDL Cholesterol Cal: 47 mg/dL — ABNORMAL HIGH (ref 5–40)

## 2016-10-08 LAB — MICROALBUMIN / CREATININE URINE RATIO
CREATININE, UR: 141.9 mg/dL
MICROALB/CREAT RATIO: 2.5 mg/g{creat} (ref 0.0–30.0)
Microalbumin, Urine: 3.5 ug/mL

## 2016-10-10 ENCOUNTER — Encounter: Payer: Self-pay | Admitting: Family Medicine

## 2016-11-24 ENCOUNTER — Other Ambulatory Visit: Payer: Self-pay | Admitting: Family Medicine

## 2017-02-23 ENCOUNTER — Other Ambulatory Visit: Payer: Self-pay | Admitting: Family Medicine

## 2017-03-01 ENCOUNTER — Encounter: Payer: Self-pay | Admitting: Family Medicine

## 2017-03-01 ENCOUNTER — Ambulatory Visit (INDEPENDENT_AMBULATORY_CARE_PROVIDER_SITE_OTHER): Payer: Medicare Other | Admitting: Family Medicine

## 2017-03-01 VITALS — BP 128/80 | Ht 63.0 in | Wt 207.5 lb

## 2017-03-01 DIAGNOSIS — J310 Chronic rhinitis: Secondary | ICD-10-CM

## 2017-03-01 DIAGNOSIS — J329 Chronic sinusitis, unspecified: Secondary | ICD-10-CM

## 2017-03-01 MED ORDER — AMOXICILLIN-POT CLAVULANATE 875-125 MG PO TABS: 1.0000 | ORAL_TABLET | Freq: Two times a day (BID) | ORAL | 0 refills | Status: AC

## 2017-03-01 NOTE — Progress Notes (Signed)
   Subjective:    Patient ID: Amy Hendrix, female    DOB: 02/18/1951, 66 y.o.   MRN: [id number]  Cough  This is a new problem. The current episode started 1 to 4 weeks ago. Associated symptoms include ear pain, a fever, headaches, rhinorrhea, a sore throat and wheezing. Associated symptoms comments: Diarrhea. Treatments tried: Benadryl, Tylenol, cough syrup    Cough cong and gunkiness and discharge  Started first as exposure to older bro with similr symtoms  flt sick six d ays later  bfeeling much better now  Notes chronic sinus cong and fontal headache  Also feels symtoms in the chest, intermittent swelling thr glands, not a lot of sore tht  Did have fiever intermittently, still experiiencing      Patient states no other concerns this visit.  Review of Systems  Constitutional: Positive for fever.  HENT: Positive for ear pain, rhinorrhea and sore throat.   Respiratory: Positive for cough and wheezing.   Neurological: Positive for headaches.       Objective:   Physical Exam  Alert, mild malaise. Hydration good Vitals stable. frontal/ maxillary tenderness evident positive nasal congestion. pharynx normal neck supple  lungs clear/no crackles or wheezes. heart regular in rhythm       Assessment & Plan:  Impression rhinosinusitis likely post viral, discussed with patient. plan antibiotics prescribed. Questions answered. Symptomatic care discussed. warning signs discussed. WSL Post viral, husband had similar illness, has not seemed to flareup sarcoidosis thankfully

## 2017-03-23 ENCOUNTER — Encounter: Payer: Self-pay | Admitting: Family Medicine

## 2017-03-23 ENCOUNTER — Ambulatory Visit (INDEPENDENT_AMBULATORY_CARE_PROVIDER_SITE_OTHER): Payer: Medicare Other | Admitting: Family Medicine

## 2017-03-23 VITALS — BP 122/82 | Ht 63.0 in | Wt 209.4 lb

## 2017-03-23 DIAGNOSIS — E119 Type 2 diabetes mellitus without complications: Secondary | ICD-10-CM

## 2017-03-23 DIAGNOSIS — E039 Hypothyroidism, unspecified: Secondary | ICD-10-CM | POA: Diagnosis not present

## 2017-03-23 DIAGNOSIS — E785 Hyperlipidemia, unspecified: Secondary | ICD-10-CM

## 2017-03-23 DIAGNOSIS — F3341 Major depressive disorder, recurrent, in partial remission: Secondary | ICD-10-CM

## 2017-03-23 DIAGNOSIS — D869 Sarcoidosis, unspecified: Secondary | ICD-10-CM | POA: Diagnosis not present

## 2017-03-23 DIAGNOSIS — I10 Essential (primary) hypertension: Secondary | ICD-10-CM

## 2017-03-23 LAB — POCT GLYCOSYLATED HEMOGLOBIN (HGB A1C): Hemoglobin A1C: 7.8

## 2017-03-23 MED ORDER — ATORVASTATIN CALCIUM 40 MG PO TABS: 40.0000 mg | ORAL_TABLET | Freq: Every day | ORAL | 1 refills | Status: DC

## 2017-03-23 MED ORDER — INDAPAMIDE 2.5 MG PO TABS: 2.5000 mg | ORAL_TABLET | Freq: Every day | ORAL | 0 refills | Status: DC

## 2017-03-23 MED ORDER — LEVOTHYROXINE SODIUM 112 MCG PO TABS: ORAL_TABLET | ORAL | 1 refills | Status: DC

## 2017-03-23 MED ORDER — METFORMIN HCL 500 MG PO TABS: ORAL_TABLET | ORAL | 1 refills | Status: DC

## 2017-03-23 MED ORDER — POTASSIUM CHLORIDE CRYS ER 20 MEQ PO TBCR: 60.0000 meq | EXTENDED_RELEASE_TABLET | Freq: Every day | ORAL | 1 refills | Status: DC

## 2017-03-23 MED ORDER — ESCITALOPRAM OXALATE 10 MG PO TABS: 10.0000 mg | ORAL_TABLET | Freq: Every day | ORAL | 1 refills | Status: DC

## 2017-03-23 MED ORDER — COLESTIPOL HCL 1 G PO TABS: 2.0000 g | ORAL_TABLET | Freq: Every day | ORAL | 3 refills | Status: DC

## 2017-03-23 MED ORDER — ATENOLOL 50 MG PO TABS: 50.0000 mg | ORAL_TABLET | Freq: Every day | ORAL | 1 refills | Status: DC

## 2017-03-23 MED ORDER — GLIPIZIDE 5 MG PO TABS: 5.0000 mg | ORAL_TABLET | Freq: Every day | ORAL | 5 refills | Status: DC

## 2017-03-23 NOTE — Progress Notes (Signed)
   Subjective:    Patient ID: Amy Hendrix, female    DOB: 02/08/1951, 66 y.o.   MRN: [id number]  Diabetes  She presents for her follow-up diabetic visit. She has type 2 diabetes mellitus. Risk factors for coronary artery disease include diabetes mellitus and post-menopausal. She is compliant with treatment all of the time. Her weight is stable. She is following a diabetic diet. She has not had a previous visit with a dietitian. She does not see a podiatrist.Eye exam is not current.    Patient claims compliance with diabetes medication. No obvious side effects. Reports no substantial low sugar spells. Most numbers are generally in good range when checked fasting. Generally does not miss a dose of medication. Watching diabetic diet closely  No tissue composition recorded. Results for orders placed or performed in visit on 03/23/17  POCT glycosylated hemoglobin (Hb A1C)  Result Value Ref Range   Hemoglobin A1C 7.8    Patient continues to take lipid medication regularly. No obvious side effects from it. Generally does not miss a dose. Prior blood work results are reviewed with patient. Patient continues to work on fat intake in diet  Patient notes ongoing compliance with antidepressant medication. No obvious side effects. Reports does not miss a dose. Overall continues to help depression substantially. No thoughts of homicide or suicide. Would like to maintain medication.  [place] tow d per wk  Still feeling down bout mother, and ovdrall depr persists      Review of Systems No headache, no major weight loss or weight gain, no chest pain no back pain abdominal pain no change in bowel habits complete ROS otherwise negative     Objective:   Physical Exam Alert and oriented, vitals reviewed and stable, NAD ENT-TM's and ext canals WNL bilat via otoscopic exam Soft palate, tonsils and post pharynx WNL via oropharyngeal exam Neck-symmetric, no masses; thyroid nonpalpable and  nontender Pulmonary-no tachypnea or accessory muscle use; Clear without wheezes via auscultation Card--no abnrml murmurs, rhythm reg and rate WNL Carotid pulses symmetric, without bruits        Assessment & Plan:  Impression 1 hypertension good control discussed maintain same meds #2 hypothyroidism status uncertain #3 sarcoidosis clinically stable discussed #4 diabetes suboptimum A1c meds adjustment 5 depression clinically stable discussed plan diet exercise discussed. Meds refilled. Appropriate blood work

## 2017-03-24 LAB — LIPID PANEL
CHOLESTEROL TOTAL: 151 mg/dL (ref 100–199)
Chol/HDL Ratio: 4.2 ratio (ref 0.0–4.4)
HDL: 36 mg/dL — ABNORMAL LOW (ref >39)
LDL Calculated: 76 mg/dL (ref 0–99)
Triglycerides: 193 mg/dL — ABNORMAL HIGH (ref 0–149)
VLDL CHOLESTEROL CAL: 39 mg/dL (ref 5–40)

## 2017-03-24 LAB — HEPATIC FUNCTION PANEL
ALBUMIN: 4.4 g/dL (ref 3.6–4.8)
ALK PHOS: 79 IU/L (ref 39–117)
ALT: 21 IU/L (ref 0–32)
AST: 21 IU/L (ref 0–40)
BILIRUBIN TOTAL: 1 mg/dL (ref 0.0–1.2)
Bilirubin, Direct: 0.27 mg/dL (ref 0.00–0.40)
Total Protein: 6.5 g/dL (ref 6.0–8.5)

## 2017-03-24 LAB — TSH: TSH: 0.381 u[IU]/mL — AB (ref 0.450–4.500)

## 2017-03-25 ENCOUNTER — Other Ambulatory Visit: Payer: Self-pay | Admitting: *Deleted

## 2017-03-25 MED ORDER — LEVOTHYROXINE SODIUM 100 MCG PO TABS: ORAL_TABLET | ORAL | 1 refills | Status: DC

## 2017-03-30 ENCOUNTER — Ambulatory Visit: Payer: Medicare Other | Admitting: Family Medicine

## 2017-07-20 ENCOUNTER — Ambulatory Visit (INDEPENDENT_AMBULATORY_CARE_PROVIDER_SITE_OTHER): Payer: Medicare Other | Admitting: Family Medicine

## 2017-07-20 VITALS — BP 118/70 | Temp 98.5°F | Ht 63.0 in | Wt 217.0 lb

## 2017-07-20 DIAGNOSIS — J329 Chronic sinusitis, unspecified: Secondary | ICD-10-CM | POA: Diagnosis not present

## 2017-07-20 DIAGNOSIS — R109 Unspecified abdominal pain: Secondary | ICD-10-CM

## 2017-07-20 DIAGNOSIS — Z23 Encounter for immunization: Secondary | ICD-10-CM | POA: Diagnosis not present

## 2017-07-20 MED ORDER — AMOXICILLIN 500 MG PO CAPS: 500.0000 mg | ORAL_CAPSULE | Freq: Three times a day (TID) | ORAL | 0 refills | Status: DC

## 2017-07-20 NOTE — Progress Notes (Signed)
   Subjective:    Patient ID: Amy Hendrix, female    DOB: 10/03/1950, 66 y.o.   MRN: [id number]  HPIPossible hernia. Abdominal pain and a lump mid abdomen. noticied it August 5th.   Pt was jumping up into the bed, noted sig discomfort ant abdomen  Felt pain then  Now has noted bulging, concerned about a hernia.has felt mid abdominal pain. Worse with certain movements. Comes and goes. Often of a burning nature. No fever no chills. No diarrhea no vomiting   Got outdoors a lot on Saturday, developed cong and dranage ancd cough   Frontal cong and dranage and constant    Sinus symptoms. Sinus drainage, sore throat, started last night. Frontal headache. Intermittent cough. Mild sore throat this morningno high fever.  Flu vaccine.     Review of Systems No headache, no major weight loss or weight gain, no chest pain no back pain abdominal pain no change in bowel habits complete ROS otherwise negative     Objective:   Physical Exam Alert and oriented, vitals reviewed and stable, NAD ENT-TM's and ext canals  However significant nasal congestion and discharge and frontal tenderness.  WNL bilat via otoscopic exam Soft palate, tonsils and post pharynx WNL via oropharyngeal exam Neck-symmetric, no masses; thyroid nonpalpable and nontender Pulmonary-no tachypnea or accessory muscle use; Clear without wheezes via auscultation Card--no abnrml murmurs, rhythm reg and rate WNL Carotid pulses symmetric, without bruits   Abdomen large therefore somewhat difficult to evaluate large panniculus palpated. Underneath there appears to be a mid abdomen defect above the umbilicus    Assessment & Plan:  Impression 1 probableabdominal hernia. Discussed. Needs new scan to assess. Very painful to palpation intermittently.  #2 early rhinosinusitis patient states wheninvolved like this, it almost always goes on to full-blown sinus infection.

## 2017-07-21 LAB — BASIC METABOLIC PANEL
BUN/Creatinine Ratio: 15 (ref 12–28)
BUN: 20 mg/dL (ref 8–27)
CALCIUM: 10.2 mg/dL (ref 8.7–10.3)
CO2: 25 mmol/L (ref 20–29)
CREATININE: 1.31 mg/dL — AB (ref 0.57–1.00)
Chloride: 101 mmol/L (ref 96–106)
GFR calc Af Amer: 48 mL/min/{1.73_m2} — ABNORMAL LOW (ref >59)
GFR, EST NON AFRICAN AMERICAN: 42 mL/min/{1.73_m2} — AB (ref >59)
Glucose: 71 mg/dL (ref 65–99)
Potassium: 4.6 mmol/L (ref 3.5–5.2)
Sodium: 144 mmol/L (ref 134–144)

## 2017-07-24 ENCOUNTER — Other Ambulatory Visit: Payer: Self-pay | Admitting: Family Medicine

## 2017-07-26 ENCOUNTER — Telehealth: Payer: Self-pay | Admitting: Family Medicine

## 2017-07-26 ENCOUNTER — Other Ambulatory Visit: Payer: Self-pay | Admitting: *Deleted

## 2017-07-26 MED ORDER — BENZONATATE 100 MG PO CAPS: 100.0000 mg | ORAL_CAPSULE | Freq: Four times a day (QID) | ORAL | 1 refills | Status: DC | PRN

## 2017-07-26 MED ORDER — BENZONATATE 100 MG PO CAPS: 100.0000 mg | ORAL_CAPSULE | Freq: Four times a day (QID) | ORAL | 0 refills | Status: DC | PRN

## 2017-07-26 NOTE — Telephone Encounter (Signed)
Med sent to pharm. Pt notified.  

## 2017-07-26 NOTE — Telephone Encounter (Signed)
Ok numb 30 one q six hrs prn, one ref

## 2017-07-26 NOTE — Telephone Encounter (Signed)
Started coughing Saturday night. Yesterday cough off and on. Today cough is worse. Making throat sore, no fever, no more short of breath than normal.  Not coughing up anything.

## 2017-07-26 NOTE — Telephone Encounter (Signed)
Pt is requesting a prescription for tessalon pearls   [facility]

## 2017-07-30 ENCOUNTER — Other Ambulatory Visit: Payer: Self-pay | Admitting: Family Medicine

## 2017-07-30 ENCOUNTER — Ambulatory Visit (HOSPITAL_COMMUNITY)
Admission: RE | Admit: 2017-07-30 | Discharge: 2017-07-30 | Disposition: A | Discharge status: Home / Self Care | Payer: Medicare Other | Source: Ambulatory Visit | Attending: Family Medicine | Admitting: Family Medicine

## 2017-07-30 ENCOUNTER — Encounter (HOSPITAL_COMMUNITY): Payer: Self-pay

## 2017-07-30 DIAGNOSIS — K5730 Diverticulosis of large intestine without perforation or abscess without bleeding: Secondary | ICD-10-CM | POA: Insufficient documentation

## 2017-07-30 DIAGNOSIS — N9489 Other specified conditions associated with female genital organs and menstrual cycle: Secondary | ICD-10-CM | POA: Diagnosis not present

## 2017-07-30 DIAGNOSIS — K429 Umbilical hernia without obstruction or gangrene: Secondary | ICD-10-CM | POA: Diagnosis not present

## 2017-07-30 DIAGNOSIS — K439 Ventral hernia without obstruction or gangrene: Secondary | ICD-10-CM | POA: Insufficient documentation

## 2017-07-30 DIAGNOSIS — R109 Unspecified abdominal pain: Secondary | ICD-10-CM | POA: Insufficient documentation

## 2017-07-30 MED ORDER — IOPAMIDOL (ISOVUE-300) INJECTION 61%
80.0000 mL | Freq: Once | INTRAVENOUS | Status: AC | PRN
  Administered 2017-07-30: 80 mL via INTRAVENOUS

## 2017-08-05 ENCOUNTER — Encounter: Payer: Self-pay | Admitting: Family Medicine

## 2017-08-05 ENCOUNTER — Ambulatory Visit: Payer: Medicare Other | Admitting: Family Medicine

## 2017-08-05 VITALS — BP 118/78 | Ht 63.0 in | Wt 217.5 lb

## 2017-08-05 DIAGNOSIS — K439 Ventral hernia without obstruction or gangrene: Secondary | ICD-10-CM

## 2017-08-05 DIAGNOSIS — N83209 Unspecified ovarian cyst, unspecified side: Secondary | ICD-10-CM

## 2017-08-05 NOTE — Progress Notes (Signed)
   Subjective:    Patient ID: Amy Hendrix, female    DOB: 02/08/1951, 66 y.o.   MRN: [id number]  HPI  Patient in today to discuss cyst on ovary.  States no other concerns this visit.   Patient arrives office for a very good discussion regarding her scan please see scanned results.  Review of Systems No headache, no major weight loss or weight gain, no chest pain no back pain abdominal pain no change in bowel habits complete ROS otherwise negative     Objective:   Physical Exam  Alert and oriented, vitals reviewed and stable, NAD ENT-TM's and ext canals WNL bilat via otoscopic exam Soft palate, tonsils and post pharynx WNL via oropharyngeal exam Neck-symmetric, no masses; thyroid nonpalpable and nontender Pulmonary-no tachypnea or accessory muscle use; Clear without wheezes via auscultation Card--no abnrml murmurs, rhythm reg and rate WNL Carotid pulses symmetric, without bruits       Assessment & Plan:  Impression ventral hernia.  Supraumbilical.  Symptomatic.  2 cm the scan.  Long discussion held.  Multiple questions answered.  We will press on with general surgery referral  2.  Exophytic cyst off the right kidney likely benign.  Recommend no further interventions  3.  Right adnexal cyst.  4.5 cm.  We will do ultrasound.  We will do Ca1 25.  GYN referral needed.  Patient to get back with me  Greater than 50% of this 25 minute face to face visit was spent in counseling and discussion and coordination of care regarding the above diagnosis/diagnosies

## 2017-08-06 ENCOUNTER — Telehealth: Payer: Self-pay | Admitting: Family Medicine

## 2017-08-06 LAB — CA 125: CANCER ANTIGEN (CA) 125: 17.4 U/mL (ref 0.0–38.1)

## 2017-08-06 NOTE — Telephone Encounter (Signed)
Patient has chosen Dr. Arelia SneddonMcComb at [facility] for her [facility].  She has an appointment 08/10/17 at 10:20 am.  They have there own ultrasound machine so she will not need the appointment at the hospital if we can cancel that for her.

## 2017-08-06 NOTE — Telephone Encounter (Signed)
I called and cxd the US with Scheduling Dept.

## 2017-08-06 NOTE — Telephone Encounter (Signed)
Ok that's reasonable 

## 2017-08-09 ENCOUNTER — Encounter: Payer: Self-pay | Admitting: Family Medicine

## 2017-08-13 ENCOUNTER — Ambulatory Visit (HOSPITAL_COMMUNITY): Admission: RE | Admit: 2017-08-13 | Payer: Medicare Other | Source: Ambulatory Visit

## 2017-08-24 ENCOUNTER — Other Ambulatory Visit: Payer: Self-pay | Admitting: Family Medicine

## 2017-08-24 ENCOUNTER — Ambulatory Visit: Payer: Self-pay | Admitting: General Surgery

## 2017-08-25 ENCOUNTER — Other Ambulatory Visit (INDEPENDENT_AMBULATORY_CARE_PROVIDER_SITE_OTHER): Payer: Self-pay | Admitting: *Deleted

## 2017-08-25 DIAGNOSIS — Z1211 Encounter for screening for malignant neoplasm of colon: Secondary | ICD-10-CM | POA: Insufficient documentation

## 2017-09-06 NOTE — Pre-Procedure Instructions (Signed)
Amy Hendrix  09/06/2017      [facility] - [place], [place] - [id number] [place] [id number] Phone: [phone] Fax: [phone]    Your procedure is scheduled on Friday December 14.  [facility].M[facility]  Call this number if you have problems the morning of surgery:  [phone]   Remember:  Do not eat food or drink liquids after midnight.  Take these medicines the morning of surgery with A SIP OF WATER:   Acetaminophen (tylenol) if needed Atenolol (tenormin) escitalopram (lexapro) Levothyroxine (synthroid)  DO NOT TAKE metformin (glucophage) or glipizide (glucotrol) the day of surgery.   STOP TAKING pseudoephedrine (Sudafed) today.  7 days prior to surgery STOP taking any Aspirin(unless otherwise instructed by your surgeon), Aleve, Naproxen, Ibuprofen, Motrin, Advil, Goody's, BC's, all herbal medications, fish oil, and all vitamins     How to Manage Your Diabetes Before and After Surgery  Why is it important to control my blood sugar before and after surgery? . Improving blood sugar levels before and after surgery helps healing and can limit problems. . A way of improving blood sugar control is eating a healthy diet by: o  Eating less sugar and carbohydrates o  Increasing activity/exercise o  Talking with your doctor about reaching your blood sugar goals . High blood sugars (greater than 180 mg/dL) can raise your risk of infections and slow your recovery, so you will need to focus on controlling your diabetes during the weeks before surgery. . Make sure that the doctor who takes care of your diabetes knows about your planned surgery including the date and location.  How do I manage my blood sugar before surgery? . Check your blood sugar at least 4 times a day, starting 2 days before surgery, to make sure that the level is not too high or low. o Check your blood sugar the morning  of your surgery when you wake up and every 2 hours until you get to the Short Stay unit. . If your blood sugar is less than 70 mg/dL, you will need to treat for low blood sugar: o Do not take insulin. o Treat a low blood sugar (less than 70 mg/dL) with  cup of clear juice (cranberry or apple), 4 glucose tablets, OR glucose gel. Recheck blood sugar in 15 minutes after treatment (to make sure it is greater than 70 mg/dL). If your blood sugar is not greater than 70 mg/dL on recheck, call [phone] o  for further instructions. . Report your blood sugar to the short stay nurse when you get to Short Stay.  . If you are admitted to the hospital after surgery: o Your blood sugar will be checked by the staff and you will probably be given insulin after surgery (instead of oral diabetes medicines) to make sure you have good blood sugar levels. o The goal for blood sugar control after surgery is 80-180 mg/dL.              Do not wear jewelry, make-up or nail polish.  Do not wear lotions, powders, or perfumes, or deodorant.  Do not shave 48 hours prior to surgery.  Men may shave face and neck.  Do not bring valuables to the hospital.  [facility] is not responsible for any belongings or valuables.  Contacts, dentures or bridgework may not be worn into surgery.  Leave your suitcase in the car.  After surgery it  may be brought to your room.  For patients admitted to the hospital, discharge time will be determined by your treatment team.  Patients discharged the day of surgery will not be allowed to drive home.   Special instructions:    [place]- Preparing For Surgery  Before surgery, you can play an important role. Because skin is not sterile, your skin needs to be as free of germs as possible. You can reduce the number of germs on your skin by washing with CHG (chlorahexidine gluconate) Soap before surgery.  CHG is an antiseptic cleaner which kills germs and bonds with the skin to  continue killing germs even after washing.  Please do not use if you have an allergy to CHG or antibacterial soaps. If your skin becomes reddened/irritated stop using the CHG.  Do not shave (including legs and underarms) for at least 48 hours prior to first CHG shower. It is OK to shave your face.  Please follow these instructions carefully.   1. Shower the NIGHT BEFORE SURGERY and the MORNING OF SURGERY with CHG.   2. If you chose to wash your hair, wash your hair first as usual with your normal shampoo.  3. After you shampoo, rinse your hair and body thoroughly to remove the shampoo.  4. Use CHG as you would any other liquid soap. You can apply CHG directly to the skin and wash gently with a scrungie or a clean washcloth.   5. Apply the CHG Soap to your body ONLY FROM THE NECK DOWN.  Do not use on open wounds or open sores. Avoid contact with your eyes, ears, mouth and genitals (private parts). Wash Face and genitals (private parts)  with your normal soap.  6. Wash thoroughly, paying special attention to the area where your surgery will be performed.  7. Thoroughly rinse your body with warm water from the neck down.  8. DO NOT shower/wash with your normal soap after using and rinsing off the CHG Soap.  9. Pat yourself dry with a CLEAN TOWEL.  10. Wear CLEAN PAJAMAS to bed the night before surgery, wear comfortable clothes the morning of surgery  11. Place CLEAN SHEETS on your bed the night of your first shower and DO NOT SLEEP WITH PETS.    Day of Surgery: Do not apply any deodorants/lotions. Please wear clean clothes to the hospital/surgery center.      Please read over the following fact sheets that you were given. Coughing and Deep Breathing and Surgical Site Infection Prevention

## 2017-09-07 ENCOUNTER — Encounter (HOSPITAL_COMMUNITY): Payer: Self-pay

## 2017-09-07 ENCOUNTER — Ambulatory Visit (HOSPITAL_COMMUNITY)
Admission: RE | Admit: 2017-09-07 | Discharge: 2017-09-07 | Disposition: A | Discharge status: Home / Self Care | Payer: Medicare Other | Source: Ambulatory Visit | Attending: General Surgery | Admitting: General Surgery

## 2017-09-07 ENCOUNTER — Other Ambulatory Visit: Payer: Self-pay

## 2017-09-07 ENCOUNTER — Encounter (HOSPITAL_COMMUNITY)
Admission: RE | Admit: 2017-09-07 | Discharge: 2017-09-07 | Disposition: A | Discharge status: Home / Self Care | Payer: Medicare Other | Source: Ambulatory Visit | Attending: General Surgery | Admitting: General Surgery

## 2017-09-07 DIAGNOSIS — Z01818 Encounter for other preprocedural examination: Secondary | ICD-10-CM | POA: Insufficient documentation

## 2017-09-07 DIAGNOSIS — Z01810 Encounter for preprocedural cardiovascular examination: Secondary | ICD-10-CM | POA: Diagnosis present

## 2017-09-07 DIAGNOSIS — Z01812 Encounter for preprocedural laboratory examination: Secondary | ICD-10-CM | POA: Insufficient documentation

## 2017-09-07 DIAGNOSIS — K429 Umbilical hernia without obstruction or gangrene: Secondary | ICD-10-CM | POA: Diagnosis not present

## 2017-09-07 HISTORY — DX: Unspecified osteoarthritis, unspecified site: M19.90

## 2017-09-07 HISTORY — DX: Other specified postprocedural states: R11.2

## 2017-09-07 HISTORY — DX: Depression, unspecified: F32.A

## 2017-09-07 HISTORY — DX: Major depressive disorder, single episode, unspecified: F32.9

## 2017-09-07 HISTORY — DX: Nausea with vomiting, unspecified: Z98.890

## 2017-09-07 LAB — BASIC METABOLIC PANEL
Anion gap: 10 (ref 5–15)
BUN: 23 mg/dL — AB (ref 6–20)
CHLORIDE: 100 mmol/L — AB (ref 101–111)
CO2: 27 mmol/L (ref 22–32)
Calcium: 9.5 mg/dL (ref 8.9–10.3)
Creatinine, Ser: 1.2 mg/dL — ABNORMAL HIGH (ref 0.44–1.00)
GFR calc non Af Amer: 45 mL/min — ABNORMAL LOW (ref >60)
GFR, EST AFRICAN AMERICAN: 53 mL/min — AB (ref >60)
Glucose, Bld: 108 mg/dL — ABNORMAL HIGH (ref 65–99)
POTASSIUM: 4.2 mmol/L (ref 3.5–5.1)
SODIUM: 137 mmol/L (ref 135–145)

## 2017-09-07 LAB — CBC WITH DIFFERENTIAL/PLATELET
Basophils Absolute: 0 10*3/uL (ref 0.0–0.1)
Basophils Relative: 0 %
Eosinophils Absolute: 0.3 10*3/uL (ref 0.0–0.7)
Eosinophils Relative: 3 %
HCT: 40.8 % (ref 36.0–46.0)
HEMOGLOBIN: 14.2 g/dL (ref 12.0–15.0)
LYMPHS ABS: 3 10*3/uL (ref 0.7–4.0)
LYMPHS PCT: 30 %
MCH: 30.6 pg (ref 26.0–34.0)
MCHC: 34.8 g/dL (ref 30.0–36.0)
MCV: 87.9 fL (ref 78.0–100.0)
MONOS PCT: 7 %
Monocytes Absolute: 0.7 10*3/uL (ref 0.1–1.0)
NEUTROS PCT: 60 %
Neutro Abs: 6 10*3/uL (ref 1.7–7.7)
Platelets: 206 10*3/uL (ref 150–400)
RBC: 4.64 MIL/uL (ref 3.87–5.11)
RDW: 13.6 % (ref 11.5–15.5)
WBC: 9.9 10*3/uL (ref 4.0–10.5)

## 2017-09-07 LAB — GLUCOSE, CAPILLARY: Glucose-Capillary: 150 mg/dL — ABNORMAL HIGH (ref 65–99)

## 2017-09-07 LAB — HEMOGLOBIN A1C
Hgb A1c MFr Bld: 6 % — ABNORMAL HIGH (ref 4.8–5.6)
Mean Plasma Glucose: 125.5 mg/dL

## 2017-09-07 NOTE — Progress Notes (Signed)
PCP: Dr. Nelva NayWmGerda Diss. [place]  [place] cardiologist  Fasting sugars: pt. doesn't check, even though she has a meter. Encourage pt. To check sugars .

## 2017-09-09 NOTE — Anesthesia Preprocedure Evaluation (Addendum)
Anesthesia Evaluation  Patient identified by MRN, date of birth, ID band Patient awake    Reviewed: Allergy & Precautions, H&P , Patient's Chart, lab work & pertinent test results, reviewed documented beta blocker date and time   Airway Mallampati: II  TM Distance: >3 FB Neck ROM: full    Dental no notable dental hx.    Pulmonary    Pulmonary exam normal breath sounds clear to auscultation       Cardiovascular hypertension,  Rhythm:regular Rate:Normal     Neuro/Psych    GI/Hepatic   Endo/Other  diabetes  Renal/GU      Musculoskeletal   Abdominal   Peds  Hematology   Anesthesia Other Findings   Reproductive/Obstetrics                             Anesthesia Physical Anesthesia Plan  ASA: III  Anesthesia Plan: General   Post-op Pain Management:    Induction: Intravenous  PONV Risk Score and Plan: 3 and Treatment may vary due to age or medical condition, Dexamethasone and Ondansetron  Airway Management Planned: Oral ETT  Additional Equipment:   Intra-op Plan:   Post-operative Plan: Extubation in OR  Informed Consent: I have reviewed the patients History and Physical, chart, labs and discussed the procedure including the risks, benefits and alternatives for the proposed anesthesia with the patient or authorized representative who has indicated his/her understanding and acceptance.   Dental Advisory Given  Plan Discussed with: CRNA and Surgeon  Anesthesia Plan Comments: ( Maybe TIVA??   )        Anesthesia Quick Evaluation

## 2017-09-10 ENCOUNTER — Ambulatory Visit (HOSPITAL_COMMUNITY): Payer: Medicare Other | Admitting: Certified Registered Nurse Anesthetist

## 2017-09-10 ENCOUNTER — Encounter (HOSPITAL_COMMUNITY)
Admission: RE | Disposition: A | Discharge status: Home / Self Care | Payer: Self-pay | Source: Ambulatory Visit | Attending: General Surgery

## 2017-09-10 ENCOUNTER — Ambulatory Visit (HOSPITAL_COMMUNITY)
Admission: RE | Admit: 2017-09-10 | Discharge: 2017-09-10 | Disposition: A | Discharge status: Home / Self Care | Payer: Medicare Other | Source: Ambulatory Visit | Attending: General Surgery | Admitting: General Surgery

## 2017-09-10 DIAGNOSIS — Z811 Family history of alcohol abuse and dependence: Secondary | ICD-10-CM | POA: Insufficient documentation

## 2017-09-10 DIAGNOSIS — K430 Incisional hernia with obstruction, without gangrene: Secondary | ICD-10-CM | POA: Diagnosis not present

## 2017-09-10 DIAGNOSIS — J45909 Unspecified asthma, uncomplicated: Secondary | ICD-10-CM | POA: Diagnosis not present

## 2017-09-10 DIAGNOSIS — E7800 Pure hypercholesterolemia, unspecified: Secondary | ICD-10-CM | POA: Diagnosis not present

## 2017-09-10 DIAGNOSIS — Z8042 Family history of malignant neoplasm of prostate: Secondary | ICD-10-CM | POA: Insufficient documentation

## 2017-09-10 DIAGNOSIS — Z833 Family history of diabetes mellitus: Secondary | ICD-10-CM | POA: Diagnosis not present

## 2017-09-10 DIAGNOSIS — F329 Major depressive disorder, single episode, unspecified: Secondary | ICD-10-CM | POA: Insufficient documentation

## 2017-09-10 DIAGNOSIS — Z794 Long term (current) use of insulin: Secondary | ICD-10-CM | POA: Diagnosis not present

## 2017-09-10 DIAGNOSIS — Z8249 Family history of ischemic heart disease and other diseases of the circulatory system: Secondary | ICD-10-CM | POA: Insufficient documentation

## 2017-09-10 DIAGNOSIS — Z8349 Family history of other endocrine, nutritional and metabolic diseases: Secondary | ICD-10-CM | POA: Diagnosis not present

## 2017-09-10 DIAGNOSIS — E119 Type 2 diabetes mellitus without complications: Secondary | ICD-10-CM | POA: Diagnosis not present

## 2017-09-10 DIAGNOSIS — Z841 Family history of disorders of kidney and ureter: Secondary | ICD-10-CM | POA: Insufficient documentation

## 2017-09-10 DIAGNOSIS — K429 Umbilical hernia without obstruction or gangrene: Secondary | ICD-10-CM | POA: Diagnosis present

## 2017-09-10 DIAGNOSIS — Z8261 Family history of arthritis: Secondary | ICD-10-CM | POA: Insufficient documentation

## 2017-09-10 DIAGNOSIS — Z79899 Other long term (current) drug therapy: Secondary | ICD-10-CM | POA: Diagnosis not present

## 2017-09-10 DIAGNOSIS — K8020 Calculus of gallbladder without cholecystitis without obstruction: Secondary | ICD-10-CM | POA: Insufficient documentation

## 2017-09-10 DIAGNOSIS — Z8371 Family history of colonic polyps: Secondary | ICD-10-CM | POA: Diagnosis not present

## 2017-09-10 HISTORY — PX: INSERTION OF MESH: SHX5868

## 2017-09-10 HISTORY — PX: UMBILICAL HERNIA REPAIR: SHX196

## 2017-09-10 LAB — GLUCOSE, CAPILLARY
Glucose-Capillary: 135 mg/dL — ABNORMAL HIGH (ref 65–99)
Glucose-Capillary: 166 mg/dL — ABNORMAL HIGH (ref 65–99)

## 2017-09-10 SURGERY — REPAIR, HERNIA, UMBILICAL, LAPAROSCOPIC
Anesthesia: General | Site: Abdomen

## 2017-09-10 MED ORDER — ONDANSETRON HCL 4 MG/2ML IJ SOLN
INTRAMUSCULAR | Status: DC | PRN
  Administered 2017-09-10: 4 mg via INTRAVENOUS

## 2017-09-10 MED ORDER — SODIUM CHLORIDE 0.9 % IV SOLN
INTRAVENOUS | Status: DC | PRN
  Administered 2017-09-10: 500 mL

## 2017-09-10 MED ORDER — DEXAMETHASONE SODIUM PHOSPHATE 4 MG/ML IJ SOLN
INTRAMUSCULAR | Status: DC | PRN
  Administered 2017-09-10: 5 mg via INTRAVENOUS

## 2017-09-10 MED ORDER — GABAPENTIN 300 MG PO CAPS
300.0000 mg | ORAL_CAPSULE | ORAL | Status: AC
  Administered 2017-09-10: 300 mg via ORAL

## 2017-09-10 MED ORDER — CHLORHEXIDINE GLUCONATE CLOTH 2 % EX PADS: 6.0000 | MEDICATED_PAD | Freq: Once | CUTANEOUS | Status: DC

## 2017-09-10 MED ORDER — CELECOXIB 200 MG PO CAPS
200.0000 mg | ORAL_CAPSULE | ORAL | Status: AC
  Administered 2017-09-10: 200 mg via ORAL

## 2017-09-10 MED ORDER — PROPOFOL 10 MG/ML IV BOLUS
INTRAVENOUS | Status: DC | PRN
  Administered 2017-09-10: 150 mg via INTRAVENOUS

## 2017-09-10 MED ORDER — ONDANSETRON HCL 4 MG/2ML IJ SOLN
INTRAMUSCULAR | Status: AC
  Administered 2017-09-10: 4 mg via INTRAVENOUS
  Filled 2017-09-10: qty 2

## 2017-09-10 MED ORDER — CELECOXIB 200 MG PO CAPS
ORAL_CAPSULE | ORAL | Status: AC
  Filled 2017-09-10: qty 1

## 2017-09-10 MED ORDER — PROPOFOL 10 MG/ML IV BOLUS
INTRAVENOUS | Status: AC
  Filled 2017-09-10: qty 40

## 2017-09-10 MED ORDER — EPHEDRINE SULFATE 50 MG/ML IJ SOLN
INTRAMUSCULAR | Status: DC | PRN
  Administered 2017-09-10 (×2): 5 mg via INTRAVENOUS

## 2017-09-10 MED ORDER — ONDANSETRON HCL 4 MG/2ML IJ SOLN
4.0000 mg | Freq: Once | INTRAMUSCULAR | Status: AC | PRN
  Administered 2017-09-10: 4 mg via INTRAVENOUS

## 2017-09-10 MED ORDER — FENTANYL CITRATE (PF) 100 MCG/2ML IJ SOLN
INTRAMUSCULAR | Status: AC
  Administered 2017-09-10: 25 ug via INTRAVENOUS
  Filled 2017-09-10: qty 2

## 2017-09-10 MED ORDER — FENTANYL CITRATE (PF) 250 MCG/5ML IJ SOLN
INTRAMUSCULAR | Status: AC
  Filled 2017-09-10: qty 5

## 2017-09-10 MED ORDER — LIDOCAINE HCL (CARDIAC) 20 MG/ML IV SOLN
INTRAVENOUS | Status: DC | PRN
  Administered 2017-09-10: 100 mg via INTRAVENOUS

## 2017-09-10 MED ORDER — MIDAZOLAM HCL 5 MG/5ML IJ SOLN
INTRAMUSCULAR | Status: DC | PRN
  Administered 2017-09-10: 1 mg via INTRAVENOUS

## 2017-09-10 MED ORDER — OXYCODONE HCL 5 MG PO TABS
ORAL_TABLET | ORAL | Status: AC
  Administered 2017-09-10: 5 mg via ORAL
  Filled 2017-09-10: qty 1

## 2017-09-10 MED ORDER — FENTANYL CITRATE (PF) 100 MCG/2ML IJ SOLN
25.0000 ug | INTRAMUSCULAR | Status: DC | PRN
  Administered 2017-09-10: 50 ug via INTRAVENOUS
  Administered 2017-09-10 (×3): 25 ug via INTRAVENOUS

## 2017-09-10 MED ORDER — MIDAZOLAM HCL 2 MG/2ML IJ SOLN
INTRAMUSCULAR | Status: AC
  Filled 2017-09-10: qty 2

## 2017-09-10 MED ORDER — BUPIVACAINE-EPINEPHRINE 0.25% -1:200000 IJ SOLN
INTRAMUSCULAR | Status: DC | PRN
  Administered 2017-09-10: 30 mL

## 2017-09-10 MED ORDER — SUGAMMADEX SODIUM 200 MG/2ML IV SOLN
INTRAVENOUS | Status: DC | PRN
  Administered 2017-09-10: 199.4 mg via INTRAVENOUS

## 2017-09-10 MED ORDER — OXYCODONE HCL 5 MG PO TABS: 5.0000 mg | ORAL_TABLET | Freq: Four times a day (QID) | ORAL | 0 refills | Status: DC | PRN

## 2017-09-10 MED ORDER — FENTANYL CITRATE (PF) 100 MCG/2ML IJ SOLN
INTRAMUSCULAR | Status: AC
Start: 2017-09-10 — End: 2017-09-10
  Administered 2017-09-10: 25 ug via INTRAVENOUS
  Filled 2017-09-10: qty 2

## 2017-09-10 MED ORDER — ACETAMINOPHEN 500 MG PO TABS: 1000.0000 mg | ORAL_TABLET | ORAL | Status: DC

## 2017-09-10 MED ORDER — GLYCOPYRROLATE 0.2 MG/ML IJ SOLN
INTRAMUSCULAR | Status: DC | PRN
  Administered 2017-09-10: 0.2 mg via INTRAVENOUS

## 2017-09-10 MED ORDER — STERILE WATER FOR IRRIGATION IR SOLN
Status: DC | PRN
  Administered 2017-09-10: 1000 mL

## 2017-09-10 MED ORDER — CEFAZOLIN SODIUM-DEXTROSE 2-4 GM/100ML-% IV SOLN
INTRAVENOUS | Status: AC
  Filled 2017-09-10: qty 100

## 2017-09-10 MED ORDER — FENTANYL CITRATE (PF) 250 MCG/5ML IJ SOLN
INTRAMUSCULAR | Status: DC | PRN
  Administered 2017-09-10: 100 ug via INTRAVENOUS
  Administered 2017-09-10 (×3): 50 ug via INTRAVENOUS

## 2017-09-10 MED ORDER — BUPIVACAINE-EPINEPHRINE (PF) 0.25% -1:200000 IJ SOLN
INTRAMUSCULAR | Status: AC
  Filled 2017-09-10: qty 30

## 2017-09-10 MED ORDER — CEFAZOLIN SODIUM-DEXTROSE 2-4 GM/100ML-% IV SOLN
2.0000 g | INTRAVENOUS | Status: AC
  Administered 2017-09-10: 2 g via INTRAVENOUS

## 2017-09-10 MED ORDER — ROCURONIUM BROMIDE 100 MG/10ML IV SOLN
INTRAVENOUS | Status: DC | PRN
  Administered 2017-09-10: 50 mg via INTRAVENOUS

## 2017-09-10 MED ORDER — GABAPENTIN 300 MG PO CAPS
ORAL_CAPSULE | ORAL | Status: AC
  Filled 2017-09-10: qty 1

## 2017-09-10 MED ORDER — LACTATED RINGERS IV SOLN
INTRAVENOUS | Status: DC | PRN
  Administered 2017-09-10: 07:00:00 via INTRAVENOUS

## 2017-09-10 MED ORDER — ACETAMINOPHEN 500 MG PO TABS
ORAL_TABLET | ORAL | Status: AC
  Filled 2017-09-10: qty 2

## 2017-09-10 MED ORDER — OXYCODONE HCL 5 MG PO TABS
5.0000 mg | ORAL_TABLET | Freq: Once | ORAL | Status: AC
  Administered 2017-09-10: 5 mg via ORAL

## 2017-09-10 MED ORDER — 0.9 % SODIUM CHLORIDE (POUR BTL) OPTIME
TOPICAL | Status: DC | PRN
  Administered 2017-09-10: 1000 mL

## 2017-09-10 SURGICAL SUPPLY — 51 items
ADH SKN CLS APL DERMABOND .7 (GAUZE/BANDAGES/DRESSINGS) ×1
BAG DECANTER FOR FLEXI CONT (MISCELLANEOUS) ×3 IMPLANT
BINDER ABDOMINAL 12 ML 46-62 (SOFTGOODS) ×2 IMPLANT
CANISTER SUCT 3000ML PPV (MISCELLANEOUS) IMPLANT
CHLORAPREP W/TINT 26ML (MISCELLANEOUS) ×3 IMPLANT
CLOSURE WOUND 1/2 X4 (GAUZE/BANDAGES/DRESSINGS) ×1
COVER SURGICAL LIGHT HANDLE (MISCELLANEOUS) ×3 IMPLANT
DECANTER SPIKE VIAL GLASS SM (MISCELLANEOUS) ×3 IMPLANT
DERMABOND ADVANCED (GAUZE/BANDAGES/DRESSINGS) ×2
DERMABOND ADVANCED .7 DNX12 (GAUZE/BANDAGES/DRESSINGS) ×1 IMPLANT
DEVICE SECURE STRAP 25 ABSORB (INSTRUMENTS) ×5 IMPLANT
DEVICE TROCAR PUNCTURE CLOSURE (ENDOMECHANICALS) ×3 IMPLANT
DRSG TEGADERM 2-3/8X2-3/4 SM (GAUZE/BANDAGES/DRESSINGS) ×5 IMPLANT
ELECT REM PT RETURN 9FT ADLT (ELECTROSURGICAL) ×3
ELECTRODE REM PT RTRN 9FT ADLT (ELECTROSURGICAL) ×1 IMPLANT
GLOVE BIO SURGEON STRL SZ7.5 (GLOVE) ×4 IMPLANT
GLOVE BIOGEL PI IND STRL 6.5 (GLOVE) IMPLANT
GLOVE BIOGEL PI IND STRL 7.0 (GLOVE) IMPLANT
GLOVE BIOGEL PI IND STRL 7.5 (GLOVE) IMPLANT
GLOVE BIOGEL PI IND STRL 8 (GLOVE) ×1 IMPLANT
GLOVE BIOGEL PI IND STRL 8.5 (GLOVE) IMPLANT
GLOVE BIOGEL PI INDICATOR 6.5 (GLOVE) ×2
GLOVE BIOGEL PI INDICATOR 7.0 (GLOVE) ×2
GLOVE BIOGEL PI INDICATOR 7.5 (GLOVE) ×2
GLOVE BIOGEL PI INDICATOR 8 (GLOVE) ×2
GLOVE BIOGEL PI INDICATOR 8.5 (GLOVE) ×2
GLOVE ECLIPSE 7.5 STRL STRAW (GLOVE) ×3 IMPLANT
GLOVE SS BIOGEL STRL SZ 8 (GLOVE) IMPLANT
GLOVE SUPERSENSE BIOGEL SZ 8 (GLOVE) ×2
GLOVE SURG SS PI 6.0 STRL IVOR (GLOVE) ×2 IMPLANT
GOWN STRL REUS W/ TWL LRG LVL3 (GOWN DISPOSABLE) ×3 IMPLANT
GOWN STRL REUS W/TWL LRG LVL3 (GOWN DISPOSABLE) ×6
KIT BASIN OR (CUSTOM PROCEDURE TRAY) ×3 IMPLANT
KIT ROOM TURNOVER OR (KITS) ×3 IMPLANT
MARKER SKIN DUAL TIP RULER LAB (MISCELLANEOUS) ×3 IMPLANT
MESH VENTRALIGHT ST 6IN CRC (Mesh General) ×2 IMPLANT
NDL INSUFFLATION 14GA 120MM (NEEDLE) ×1 IMPLANT
NDL SPNL 22GX3.5 QUINCKE BK (NEEDLE) ×1 IMPLANT
NEEDLE INSUFFLATION 14GA 120MM (NEEDLE) ×3 IMPLANT
NEEDLE SPNL 22GX3.5 QUINCKE BK (NEEDLE) ×3 IMPLANT
NS IRRIG 1000ML POUR BTL (IV SOLUTION) ×3 IMPLANT
PAD ARMBOARD 7.5X6 YLW CONV (MISCELLANEOUS) ×6 IMPLANT
SLEEVE ENDOPATH XCEL 5M (ENDOMECHANICALS) ×5 IMPLANT
STRIP CLOSURE SKIN 1/2X4 (GAUZE/BANDAGES/DRESSINGS) ×2 IMPLANT
SUT MNCRL AB 4-0 PS2 18 (SUTURE) ×3 IMPLANT
SUT NOVA NAB GS-21 0 18 T12 DT (SUTURE) ×3 IMPLANT
TRAY LAPAROSCOPIC MC (CUSTOM PROCEDURE TRAY) ×2 IMPLANT
TROCAR XCEL NON-BLD 11X100MML (ENDOMECHANICALS) ×2 IMPLANT
TROCAR XCEL NON-BLD 5MMX100MML (ENDOMECHANICALS) ×3 IMPLANT
TUBING INSUFFLATION (TUBING) ×3 IMPLANT
WATER STERILE IRR 1000ML POUR (IV SOLUTION) ×2 IMPLANT

## 2017-09-10 NOTE — Discharge Instructions (Addendum)
Laparoscopic Ventral Hernia Repair, Care After This sheet gives you information about how to care for yourself after your procedure. Your health care provider may also give you more specific instructions. If you have problems or questions, contact your health care provider. What can I expect after the procedure? After the procedure, it is common to have:  Pain, discomfort, or soreness.  Follow these instructions at home: Incision care  Follow instructions from your health care provider about how to take care of your incision. Make sure you: ? Wash your hands with soap and water before you change your bandage (dressing) or before you touch your abdomen. If soap and water are not available, use hand sanitizer. ? Change your dressing as told by your health care provider. ? Leave stitches (sutures), skin glue, or adhesive strips in place. These skin closures may need to stay in place for 2 weeks or longer. If adhesive strip edges start to loosen and curl up, you may trim the loose edges. Do not remove adhesive strips completely unless your health care provider tells you to do that.  Check your incision area every day for signs of infection. Check for: ? Redness, swelling, or pain. ? Fluid or blood. ? Warmth. ? Pus or a bad smell. Bathing  Do not take baths, swim, or use a hot tub until your health care provider approves. Ask your health care provider if you can take showers. You may only be allowed to take sponge baths for bathing.  Keep your bandage (dressing) dry until your health care provider says it can be removed. Activity  Do not lift anything that is heavier than 10 lb (4.5 kg) until your health care provider approves.  Do not drive or use heavy machinery while taking prescription pain medicine. Ask your health care provider when it is safe for you to drive or use heavy machinery.  Do not drive for 24 hours if you were given a medicine to help you relax (sedative) during your  procedure.  Rest as told by your health care provider. You may return to your normal activities when your health care provider approves. General instructions  Take over-the-counter and prescription medicines only as told by your health care provider.  Abdominal binder should be snug and not tight.  Wear at all times when up and around  To prevent or treat constipation while you are taking prescription pain medicine, your health care provider may recommend that you: ? Take over-the-counter or prescription medicines. ? Eat foods that are high in fiber, such as fresh fruits and vegetables, whole grains, and beans. ? Limit foods that are high in fat and processed sugars, such as fried and sweet foods.  Drink enough fluid to keep your urine clear or pale yellow.  Hold a pillow over your abdomen when you cough or sneeze. This helps with pain.  Keep all follow-up visits as told by your health care provider. This is important. Contact a health care provider if:  You have: ? A fever or chills. ? Redness, swelling, or pain around your incision. ? Fluid or blood coming from your incision. ? Pus or a bad smell coming from your incision. ? Pain that gets worse or does not get better with medicine. ? Nausea or vomiting. ? A cough. ? Shortness of breath.  Your incision feels warm to the touch.  You have not had a bowel movement in three days.  You are not able to urinate. Get help right away if:  You have severe pain in your abdomen.  You have persistent nausea and vomiting.  You have redness, warmth, or pain in your leg.  You have chest pain.  You have trouble breathing. Summary  After this procedure, it is common to have pain, discomfort, or soreness.  Follow instructions from your health care provider about how to take care of your incision.  Check your incision area every day for signs of infection. Report any signs of infection to your health care provider.  Keep all  follow-up visits as told by your health care provider. This is important. This information is not intended to replace advice given to you by your health care provider. Make sure you discuss any questions you have with your health care provider.  Marta LamasJames O. Gae BonWyatt, III, MD, FACS [phone]--pager [phone] [facility]

## 2017-09-10 NOTE — Progress Notes (Signed)
Additional tylenol held- patient took this am at home.

## 2017-09-10 NOTE — Anesthesia Postprocedure Evaluation (Signed)
Anesthesia Post Note  Patient: Amy Hendrix  Procedure(s) Performed: LAPAROSCOPIC UMBILICAL HERNIA (N/A Abdomen) INSERTION OF MESH (N/A Abdomen)     Patient location during evaluation: PACU Anesthesia Type: General Level of consciousness: awake and alert Pain management: pain level controlled Vital Signs Assessment: post-procedure vital signs reviewed and stable Respiratory status: spontaneous breathing, nonlabored ventilation, respiratory function stable and patient connected to nasal cannula oxygen Cardiovascular status: blood pressure returned to baseline and stable Postop Assessment: no apparent nausea or vomiting Anesthetic complications: no    Last Vitals:  Vitals:   09/10/17 1117 09/10/17 1140  BP: (!) 160/101 (!) 142/70  Pulse: (!) 58 61  Resp: 16 15  Temp: (!) 36.3 C   SpO2: (!) 89% 93%    Last Pain:  Vitals:   09/10/17 1140  TempSrc:   PainSc: 3                  Zaelyn Barbary EDWARD

## 2017-09-10 NOTE — Op Note (Signed)
OPERATIVE REPORT  DATE OF OPERATION: 09/10/2017  PATIENT:  Amy Hendrix  66 y.o. female  PRE-OPERATIVE DIAGNOSIS:  Symptomatic umbilical hernia  POST-OPERATIVE DIAGNOSIS:  Symptomatic umbilical hernia. 3 cm defect  INDICATION(S) FOR OPERATION:  Painful, symptomatic periumbilical ventral hernia, seems to be at old laparoscopy sit.  FINDINGS:  Incarcerated small tongue of omentum in the hernia defect  PROCEDURE:  Procedure(s): LAPAROSCOPIC UMBILICAL HERNIA INSERTION OF MESH  SURGEON:  Surgeon(s): Jimmye NormanWyatt, Mariette Cowley, MD  ASSISTANT: RNFA, Bowman  ANESTHESIA:   general  COMPLICATIONS:  None  EBL: 20 ml  BLOOD ADMINISTERED: none  DRAINS: none   SPECIMEN:  No Specimen  COUNTS CORRECT:  YES  PROCEDURE DETAILS: The patient was taken to the operating room and placed on the table in supine position.  After adequate general endotracheal anesthetic was administered, she was prepped and draped in the usual sterile manner exposing her abdomen.  A proper timeout was performed identifying the patient and the procedure to be performed.  We entered the peritoneal cavity initially with a Veress needle in the left upper quadrant where using the saline drop test.  The peritoneal cavity safely, then insufflated carbon dioxide gas up to a maximal intra-abdominal pressure of 15 mmHg through the Veress needle.  We then used the same site in order to use an Optiview 11 mm cannula with inserted lites were obtained, and safely entered the peritoneal cavity.  We then placed 3 more 5 mm cannulas after, died at insufflation, under direct vision, and the right lower quadrant of the left lower quadrant and left mid abdomen.  We did not have to reposition the patient for the procedure.  It was noted that and a tongue of omentum was stuck in the 2-3 cm periumbilical ventral hernia defect.  We measured it site using the spinal needle and subsequently came to the conclusion that a 15 cm coated circular mesh  would be adequate.  The selected ventral light mesh was used with the coated surface placed towards the bowel.  4 equally spaced sutures were placed on the abdominal wall side of the mesh and then was subsequently passed into the peritoneal cavity through the 11 mm cannula.  Once this was done we used a suture retriever to report the sutures up to the anterior abdominal wall and tied them in place.  We subsequently used a secure strap tacker in order to circumferentially secure the mesh.  Once this was done carbon dioxide gas was allowed to escape from the peritoneal cavity as the mesh laid down nicely.  All needle counts, sponge counts, and instrument counts were correct.  PATIENT DISPOSITION:  PACU - hemodynamically stable.   Jimmye NormanJames Duwan Adrian 12/14/20188:54 AM

## 2017-09-10 NOTE — Transfer of Care (Signed)
Immediate Anesthesia Transfer of Care Note  Patient: Amy Hendrix  Procedure(s) Performed: LAPAROSCOPIC UMBILICAL HERNIA (N/A Abdomen) INSERTION OF MESH (N/A Abdomen)  Patient Location: PACU  Anesthesia Type:General  Level of Consciousness: awake, patient cooperative and responds to stimulation  Airway & Oxygen Therapy: Patient Spontanous Breathing and Patient connected to face mask oxygen  Post-op Assessment: Report given to RN, Post -op Vital signs reviewed and stable and Patient moving all extremities X 4  Post vital signs: Reviewed and stable  Last Vitals:  Vitals:   09/10/17 0622 09/10/17 0908  BP: 129/70   Pulse: (!) 59 (P) 69  Resp: 20   Temp: (!) 36.3 C (!) (P) 36.1 C  SpO2: 95% (P) 98%    Last Pain:  Vitals:   09/10/17 0622  TempSrc: Oral      Patients Stated Pain Goal: 7 (09/10/17 0601)  Complications: No apparent anesthesia complications

## 2017-09-10 NOTE — Anesthesia Procedure Notes (Signed)
Procedure Name: Intubation Date/Time: 09/10/2017 7:40 AM Performed by: Glynda Jaeger, CRNA Pre-anesthesia Checklist: Patient identified, Patient being monitored, Timeout performed, Emergency Drugs available and Suction available Patient Re-evaluated:Patient Re-evaluated prior to induction Oxygen Delivery Method: Circle System Utilized Preoxygenation: Pre-oxygenation with 100% oxygen Induction Type: IV induction Ventilation: Mask ventilation without difficulty and Oral airway inserted - appropriate to patient size Laryngoscope Size: Mac and 3 Grade View: Grade II Tube type: Oral Tube size: 7.5 mm Number of attempts: 1 Airway Equipment and Method: Stylet Placement Confirmation: ETT inserted through vocal cords under direct vision,  positive ETCO2 and breath sounds checked- equal and bilateral Secured at: 22 cm Tube secured with: Tape Dental Injury: Teeth and Oropharynx as per pre-operative assessment

## 2017-09-10 NOTE — H&P (Signed)
Amy AllanCecelia M Hendrix 08/24/2017 10:43 AM Location: [facility] Patient #: [id number] DOB: 09/06/1951 Married / Language: English / Race: White Female   History of Present Illness Amy Hendrix(Amy Ayub O. Lindie SpruceWyatt MD; 08/24/2017 11:20 AM) The patient is a 66 year old female who presents with an umbilical hernia. Symptoms include bulge at the umbilicus and abdominal pain. The pain is located in the mid-abdomen. The patient describes the pain as sharp. Onset was sudden (Happened while jumping onto a bed) 3 month(s) ago. Onset followed bending (More jumping onto a bed). The symptoms occur constantly. The episodes occur daily.   Past Surgical History (Amy Hendrix, RMA; 08/24/2017 10:44 AM) Appendectomy  Cesarean Section - Multiple  Foot Surgery  Bilateral. Gallbladder Surgery - Laparoscopic   Diagnostic Studies History (Amy Hendrix, RMA; 08/24/2017 10:44 AM) Colonoscopy  never Mammogram  >3 years ago Pap Smear  1-5 years ago  Allergies (Amy Hendrix, RMA; 08/24/2017 10:46 AM) No Known Drug Allergies 08/24/2017 Allergies Reconciled   Medication History (Amy Hendrix, RMA; 08/24/2017 10:47 AM) Potassium Bicarbonate (20MEQ Tablet Effer, Oral) Active. Colestipol HCl (1GM Tablet, Oral) Active. MetFORMIN HCl (500MG  Tablet, Oral) Active. Atorvastatin Calcium (40MG  Tablet, Oral) Active. Indapamide (2.5MG  Tablet, Oral) Active. Escitalopram Oxalate (10MG  Tablet, Oral) Active. Levothyroxine Sodium (100MCG Tablet, Oral) Active. Atenolol (50MG  Tablet, Oral) Active. GlipiZIDE (5MG  Tablet, Oral) Active. Medications Reconciled  Social History (Amy Hendrix, RMA; 08/24/2017 10:44 AM) Alcohol use  Remotely quit alcohol use. Caffeine use  Carbonated beverages, Coffee, Tea. Illicit drug use  Remotely quit drug use. Tobacco use  Never smoker.  Family History (Amy Hendrix, RMA; 08/24/2017 10:44 AM) Alcohol Abuse  Brother, Father. Arthritis  Father. Colon  Polyps  Brother, Sister. Diabetes Mellitus  Brother. Heart Disease  Brother. Hypertension  Mother, Sister. Kidney Disease  Brother. Prostate Cancer  Father. Thyroid problems  Sister.  Pregnancy / Birth History (Amy Hendrix, RMA; 08/24/2017 10:44 AM) Age at menarche  9 years. Age of menopause  2056-60 Gravida  834 Maternal age  10631-35 Para  2  Other Problems (Amy Hendrix, RMA; 08/24/2017 10:44 AM) Arthritis  Asthma  Cholelithiasis  Depression  Diabetes Mellitus  High blood pressure  Hypercholesterolemia  Other disease, cancer, significant illness  Thyroid Disease  Ventral Hernia Repair     Review of Systems (Amy A. Brown RMA; 08/24/2017 10:44 AM) General Not Present- Appetite Loss, Chills, Fatigue, Fever, Night Sweats, Weight Gain and Weight Loss. Skin Not Present- Change in Wart/Mole, Dryness, Hives, Jaundice, New Lesions, Non-Healing Wounds, Rash and Ulcer. HEENT Present- Hearing Loss, Ringing in the Ears and Wears glasses/contact lenses. Not Present- Earache, Hoarseness, Nose Bleed, Oral Ulcers, Seasonal Allergies, Sinus Pain, Sore Throat, Visual Disturbances and Yellow Eyes. Respiratory Present- Snoring. Not Present- Bloody sputum, Chronic Cough, Difficulty Breathing and Wheezing. Breast Not Present- Breast Mass, Breast Pain, Nipple Discharge and Skin Changes. Cardiovascular Present- Shortness of Breath. Not Present- Chest Pain, Difficulty Breathing Lying Down, Leg Cramps, Palpitations, Rapid Heart Rate and Swelling of Extremities. Gastrointestinal Present- Abdominal Pain. Not Present- Bloating, Bloody Stool, Change in Bowel Habits, Chronic diarrhea, Constipation, Difficulty Swallowing, Excessive gas, Gets full quickly at meals, Hemorrhoids, Indigestion, Nausea, Rectal Pain and Vomiting. Female Genitourinary Not Present- Frequency, Nocturia, Painful Urination, Pelvic Pain and Urgency. Musculoskeletal Not Present- Back Pain, Joint Pain, Joint  Stiffness, Muscle Pain, Muscle Weakness and Swelling of Extremities. Neurological Not Present- Decreased Memory, Fainting, Headaches, Numbness, Seizures, Tingling, Tremor, Trouble walking and Weakness. Psychiatric Present- Depression. Not Present- Anxiety, Bipolar,  Change in Sleep Pattern, Fearful and Frequent crying. Endocrine Not Present- Cold Intolerance, Excessive Hunger, Hair Changes, Heat Intolerance, Hot flashes and New Diabetes. Hematology Not Present- Blood Thinners, Easy Bruising, Excessive bleeding, Gland problems, HIV and Persistent Infections.  Vitals (Amy A. Brown RMA; 08/24/2017 10:45 AM) 08/24/2017 10:44 AM Weight: 219.6 lb Height: 63in Body Surface Area: 2.01 m Body Mass Index: 38.9 kg/m  Temp.: 97.62F  Pulse: 62 (Regular)  BP: 122/76 (Sitting, Left Arm, Standard)  Vitals today: P   , BP   Physical Exam (Nayra Coury O. Lindie SpruceWyatt MD; 08/24/2017 11:22 AM) General Mental Status-Alert. General Appearance-Cooperative and Well groomed. Orientation-Oriented X4. Build & Nutrition-Obese. Posture-Normal posture. Hydration-Well hydrated.  Chest and Lung Exam Chest and lung exam reveals -quiet, even and easy respiratory effort with no use of accessory muscles and on auscultation, normal breath sounds, no adventitious sounds and normal vocal resonance.  Cardiovascular Cardiovascular examination reveals -on palpation PMI is normal in location and amplitude, no palpable S3 or S4. Normal cardiac borders., normal heart sounds, regular rate and rhythm with no murmurs and normal pedal pulses bilaterally.  Abdomen Inspection Hernias - Umbilical hernia - Reducible(Very difficult to see on physical examination, but has large diastasis recti above it. Confirmed on CT).    Assessment & Plan Fayrene Fearing(Oluwasemilore Pascuzzi O. Rashana Andrew MD; 08/24/2017 11:25 AM) UMBILICAL HERNIA WITHOUT OBSTRUCTION AND WITHOUT GANGRENE (K42.9) Story: [place] Subsequently had periumbilical pain and discomfort which has persisted. Impression: Reducible, fat filled umbilical hernia. Patient wants to schedule today for laparosocpic repair with mesh. I have discussed the risks and benefits of surgery with the patient.  She is due for an uterine endometrial biopsy next week by Dr. Arelia SneddonMcComb. The results of the biopsy could alter plans for repair. Will go ahead and schedule for now.  I have not heard back from Dr. Arelia SneddonMcComb or the patient about impending utreing biopsy.  Patient ready for surgery.  I was told by her that the endometrial biopsy was negative.  Amy LamasJames O. Gae BonWyatt, III, MD, FACS [phone] [phone] [facility]

## 2017-09-13 ENCOUNTER — Encounter (HOSPITAL_COMMUNITY): Payer: Self-pay | Admitting: General Surgery

## 2017-09-22 ENCOUNTER — Ambulatory Visit: Payer: Medicare Other | Admitting: Family Medicine

## 2017-10-19 ENCOUNTER — Ambulatory Visit: Payer: Medicare Other | Admitting: Family Medicine

## 2017-10-19 ENCOUNTER — Encounter: Payer: Self-pay | Admitting: Family Medicine

## 2017-10-19 VITALS — BP 128/80 | Ht 63.0 in | Wt 217.0 lb

## 2017-10-19 DIAGNOSIS — E039 Hypothyroidism, unspecified: Secondary | ICD-10-CM | POA: Diagnosis not present

## 2017-10-19 DIAGNOSIS — Z79899 Other long term (current) drug therapy: Secondary | ICD-10-CM | POA: Diagnosis not present

## 2017-10-19 DIAGNOSIS — E119 Type 2 diabetes mellitus without complications: Secondary | ICD-10-CM | POA: Diagnosis not present

## 2017-10-19 DIAGNOSIS — I10 Essential (primary) hypertension: Secondary | ICD-10-CM

## 2017-10-19 DIAGNOSIS — E785 Hyperlipidemia, unspecified: Secondary | ICD-10-CM | POA: Diagnosis not present

## 2017-10-19 MED ORDER — COLESTIPOL HCL 1 G PO TABS: 2.0000 g | ORAL_TABLET | Freq: Every day | ORAL | 1 refills | Status: DC

## 2017-10-19 MED ORDER — ESCITALOPRAM OXALATE 10 MG PO TABS: 10.0000 mg | ORAL_TABLET | Freq: Every day | ORAL | 1 refills | Status: DC

## 2017-10-19 MED ORDER — ATENOLOL 50 MG PO TABS: 50.0000 mg | ORAL_TABLET | Freq: Every day | ORAL | 1 refills | Status: DC

## 2017-10-19 MED ORDER — POTASSIUM CHLORIDE CRYS ER 20 MEQ PO TBCR: 60.0000 meq | EXTENDED_RELEASE_TABLET | Freq: Every day | ORAL | 1 refills | Status: DC

## 2017-10-19 MED ORDER — ATORVASTATIN CALCIUM 40 MG PO TABS: 40.0000 mg | ORAL_TABLET | Freq: Every day | ORAL | 1 refills | Status: DC

## 2017-10-19 MED ORDER — METFORMIN HCL 500 MG PO TABS: ORAL_TABLET | ORAL | 1 refills | Status: DC

## 2017-10-19 MED ORDER — LEVOTHYROXINE SODIUM 100 MCG PO TABS: ORAL_TABLET | ORAL | 1 refills | Status: DC

## 2017-10-19 MED ORDER — INDAPAMIDE 2.5 MG PO TABS: 2.5000 mg | ORAL_TABLET | Freq: Every day | ORAL | 1 refills | Status: DC

## 2017-10-19 MED ORDER — GLIPIZIDE 5 MG PO TABS: 5.0000 mg | ORAL_TABLET | Freq: Every day | ORAL | 1 refills | Status: DC

## 2017-10-19 NOTE — Progress Notes (Signed)
   Subjective:    Patient ID: Amy Hendrix, female    DOB: 12/13/1950, 67 y.o.   MRN: [id number]  Diabetes  She presents for her follow-up diabetic visit. She has type 2 diabetes mellitus. Pertinent negatives for hypoglycemia include no headaches. Pertinent negatives for diabetes include no chest pain, no fatigue, no polyphagia and no weakness. She is compliant with treatment all of the time. She is following a generally healthy diet. Home blood sugar record trend: does not check blood sugar. She does not see a podiatrist.Eye exam is not current.  A1C done dec 2018. Result was 6.0     Pt went on to gyn had an us wheich showed a cyst and an thickene endometrium. Had attempt at biopsy but that "failed". Was sched for  Endometrial "d and c" equivalent and this was done in the docs office  Cyst still being followed and managed  By gyn   Patient claims compliance with diabetes medication. No obvious side effects. Reports no substantial low sugar spells. Most numbers are generally in good range when checked fasting. Generally does not miss a dose of medication. Watching diabetic diet closely  Blood pressure medicine and blood pressure levels reviewed today with patient. Compliant with blood pressure medicine. States does not miss a dose. No obvious side effects. Blood pressure generally good when checked elsewhere. Watching salt intake.   Patient continues to take lipid medication regularly. No obvious side effects from it. Generally does not miss a dose. Prior blood work results are reviewed with patient. Patient continues to work on fat intake in diet  Patient notes ongoing compliance with antidepressant medication. No obvious side effects. Reports does not miss a dose. Overall continues to help depression substantially. No thoughts of homicide or suicide. Would like to maintain medication.   Pt states no concerns today.   No headache, no major weight loss or weight gain, no chest pain no  back pain abdominal pain no change in bowel habits complete ROS otherwise negative       Objective:   Physical Exam  Alert and oriented, vitals reviewed and stable, NAD ENT-TM's and ext canals WNL bilat via otoscopic exam Soft palate, tonsils and post pharynx WNL via oropharyngeal exam Neck-symmetric, no masses; thyroid nonpalpable and nontender Pulmonary-no tachypnea or accessory muscle use; Clear without wheezes via auscultation Card--no abnrml murmurs, rhythm reg and rate WNL Carotid pulses symmetric, without bruits       Assessment & Plan:  Impression 1 type 2 diabetes overall good control discussed and maintain same  2.  Hypertension good control discussed to maintain medicine compliance discussed meds refilled  3.  Hyperlipidemia status uncertain discussed need to check  4.  Chronic depression ongoing need for meds meds discussed refilled and managed  5.  Chronic GYN concerns followed by GYN for this  Appropriate blood work further discussion based on results diet exercise discussed medications refilled recheck in 6 months

## 2017-10-20 LAB — LIPID PANEL
Chol/HDL Ratio: 3.8 ratio (ref 0.0–4.4)
Cholesterol, Total: 140 mg/dL (ref 100–199)
HDL: 37 mg/dL — ABNORMAL LOW (ref >39)
LDL Calculated: 71 mg/dL (ref 0–99)
TRIGLYCERIDES: 160 mg/dL — AB (ref 0–149)
VLDL CHOLESTEROL CAL: 32 mg/dL (ref 5–40)

## 2017-10-20 LAB — HEPATIC FUNCTION PANEL
ALK PHOS: 74 IU/L (ref 39–117)
ALT: 13 IU/L (ref 0–32)
AST: 16 IU/L (ref 0–40)
Albumin: 4.3 g/dL (ref 3.6–4.8)
BILIRUBIN, DIRECT: 0.17 mg/dL (ref 0.00–0.40)
Bilirubin Total: 0.5 mg/dL (ref 0.0–1.2)
Total Protein: 6.9 g/dL (ref 6.0–8.5)

## 2017-10-20 LAB — MICROALBUMIN / CREATININE URINE RATIO
Creatinine, Urine: 176.6 mg/dL
Microalb/Creat Ratio: 7 mg/g creat (ref 0.0–30.0)
Microalbumin, Urine: 12.4 ug/mL

## 2017-10-20 LAB — TSH: TSH: 4.86 u[IU]/mL — ABNORMAL HIGH (ref 0.450–4.500)

## 2017-10-21 ENCOUNTER — Encounter: Payer: Self-pay | Admitting: Family Medicine

## 2017-11-05 ENCOUNTER — Telehealth (INDEPENDENT_AMBULATORY_CARE_PROVIDER_SITE_OTHER): Payer: Self-pay | Admitting: *Deleted

## 2017-11-05 ENCOUNTER — Encounter (INDEPENDENT_AMBULATORY_CARE_PROVIDER_SITE_OTHER): Payer: Self-pay | Admitting: *Deleted

## 2017-11-05 MED ORDER — PEG 3350-KCL-NA BICARB-NACL 420 G PO SOLR: 4000.0000 mL | Freq: Once | ORAL | 0 refills | Status: AC

## 2017-11-05 NOTE — Telephone Encounter (Signed)
Patient needs trilyte 

## 2017-11-18 ENCOUNTER — Telehealth (INDEPENDENT_AMBULATORY_CARE_PROVIDER_SITE_OTHER): Payer: Self-pay | Admitting: *Deleted

## 2017-11-18 NOTE — Telephone Encounter (Signed)
Referring MD/PCP: steve luking   Procedure: tcs  Reason/Indication:  screening  Has patient had this procedure before?  no  If so, when, by whom and where?    Is there a family history of colon cancer?  no  Who?  What age when diagnosed?    Is patient diabetic?   yes      Does patient have prosthetic heart valve or mechanical valve?  no  Do you have a pacemaker?  no  Has patient ever had endocarditis? no  Has patient had joint replacement within last 12 months?  no  Is patient constipated or do they take laxatives? no  Does patient have a history of alcohol/drug use?  no  Is patient on blood thinner such as Coumadin, Plavix and/or Aspirin? no  Medications: see epic  Allergies: nkda  Medication Adjustment per Dr Keane Policeehman/Terri Setzer, NP: hold diabetic med evening before & morning of  Procedure date & time: 12/09/17 at 730

## 2017-11-18 NOTE — Telephone Encounter (Signed)
agree

## 2017-12-09 ENCOUNTER — Other Ambulatory Visit: Payer: Self-pay

## 2017-12-09 ENCOUNTER — Encounter (HOSPITAL_COMMUNITY)
Admission: RE | Disposition: A | Discharge status: Home / Self Care | Payer: Self-pay | Source: Ambulatory Visit | Attending: Internal Medicine

## 2017-12-09 ENCOUNTER — Encounter (HOSPITAL_COMMUNITY): Payer: Self-pay | Admitting: *Deleted

## 2017-12-09 ENCOUNTER — Ambulatory Visit (HOSPITAL_COMMUNITY)
Admission: RE | Admit: 2017-12-09 | Discharge: 2017-12-09 | Disposition: A | Discharge status: Home / Self Care | Payer: Medicare Other | Source: Ambulatory Visit | Attending: Internal Medicine | Admitting: Internal Medicine

## 2017-12-09 DIAGNOSIS — E119 Type 2 diabetes mellitus without complications: Secondary | ICD-10-CM | POA: Diagnosis not present

## 2017-12-09 DIAGNOSIS — Z1211 Encounter for screening for malignant neoplasm of colon: Secondary | ICD-10-CM | POA: Diagnosis not present

## 2017-12-09 DIAGNOSIS — Z79890 Hormone replacement therapy (postmenopausal): Secondary | ICD-10-CM | POA: Insufficient documentation

## 2017-12-09 DIAGNOSIS — Z7984 Long term (current) use of oral hypoglycemic drugs: Secondary | ICD-10-CM | POA: Diagnosis not present

## 2017-12-09 DIAGNOSIS — F329 Major depressive disorder, single episode, unspecified: Secondary | ICD-10-CM | POA: Diagnosis not present

## 2017-12-09 DIAGNOSIS — Z79899 Other long term (current) drug therapy: Secondary | ICD-10-CM | POA: Diagnosis not present

## 2017-12-09 DIAGNOSIS — K648 Other hemorrhoids: Secondary | ICD-10-CM | POA: Insufficient documentation

## 2017-12-09 DIAGNOSIS — I10 Essential (primary) hypertension: Secondary | ICD-10-CM | POA: Insufficient documentation

## 2017-12-09 DIAGNOSIS — Z8371 Family history of colonic polyps: Secondary | ICD-10-CM | POA: Diagnosis not present

## 2017-12-09 DIAGNOSIS — K5730 Diverticulosis of large intestine without perforation or abscess without bleeding: Secondary | ICD-10-CM | POA: Insufficient documentation

## 2017-12-09 DIAGNOSIS — E039 Hypothyroidism, unspecified: Secondary | ICD-10-CM | POA: Diagnosis not present

## 2017-12-09 DIAGNOSIS — D869 Sarcoidosis, unspecified: Secondary | ICD-10-CM | POA: Diagnosis not present

## 2017-12-09 HISTORY — PX: COLONOSCOPY: SHX5424

## 2017-12-09 LAB — GLUCOSE, CAPILLARY: GLUCOSE-CAPILLARY: 122 mg/dL — AB (ref 65–99)

## 2017-12-09 SURGERY — COLONOSCOPY
Anesthesia: Moderate Sedation

## 2017-12-09 MED ORDER — MIDAZOLAM HCL 5 MG/5ML IJ SOLN
INTRAMUSCULAR | Status: DC | PRN
  Administered 2017-12-09: 2 mg via INTRAVENOUS
  Administered 2017-12-09: 1 mg via INTRAVENOUS
  Administered 2017-12-09: 2 mg via INTRAVENOUS

## 2017-12-09 MED ORDER — MIDAZOLAM HCL 5 MG/5ML IJ SOLN
INTRAMUSCULAR | Status: AC
  Filled 2017-12-09: qty 10

## 2017-12-09 MED ORDER — MEPERIDINE HCL 50 MG/ML IJ SOLN
INTRAMUSCULAR | Status: DC | PRN
  Administered 2017-12-09 (×2): 25 mg via INTRAVENOUS

## 2017-12-09 MED ORDER — SODIUM CHLORIDE 0.9 % IV SOLN
INTRAVENOUS | Status: DC
  Administered 2017-12-09: 07:00:00 via INTRAVENOUS

## 2017-12-09 MED ORDER — STERILE WATER FOR IRRIGATION IR SOLN
Status: DC | PRN
  Administered 2017-12-09: 08:00:00

## 2017-12-09 MED ORDER — MEPERIDINE HCL 50 MG/ML IJ SOLN
INTRAMUSCULAR | Status: AC
  Filled 2017-12-09: qty 1

## 2017-12-09 NOTE — Op Note (Signed)
[facility] Patient Name: Amy Hendrix Procedure Date: 12/09/2017 7:10 AM MRN: [id number] Date of Birth: Feb 25, 1951 Attending MD: Lionel December , MD CSN: [id number] Age: 67 Admit Type: Outpatient Procedure:                Colonoscopy Indications:              Screening for colorectal malignant neoplasm Providers:                Lionel December, MD, Buel Ream. Thomasena Edis RN, RN, Edythe Clarity, Technician Referring MD:             Vilinda Blanks. Gerda Diss, MD Medicines:                Meperidine 50 mg IV, Midazolam 5 mg IV Complications:            No immediate complications. Estimated Blood Loss:     Estimated blood loss: none. Procedure:                Pre-Anesthesia Assessment:                           - Prior to the procedure, a History and Physical                            was performed, and patient medications and                            allergies were reviewed. The patient's tolerance of                            previous anesthesia was also reviewed. The risks                            and benefits of the procedure and the sedation                            options and risks were discussed with the patient.                            All questions were answered, and informed consent                            was obtained. Prior Anticoagulants: The patient has                            taken no previous anticoagulant or antiplatelet                            agents. ASA Grade Assessment: III - A patient with                            severe systemic disease. After reviewing the risks  and benefits, the patient was deemed in                            satisfactory condition to undergo the procedure.                           After obtaining informed consent, the colonoscope                            was passed under direct vision. Throughout the                            procedure, the patient's blood pressure, pulse, and                            oxygen saturations were monitored continuously. The                            [id number] ([id number]) was introduced through the                            anus and advanced to the the cecum, identified by                            appendiceal orifice and ileocecal valve. The                            colonoscopy was performed without difficulty. The                            patient tolerated the procedure well. The quality                            of the bowel preparation was good. The ileocecal                            valve, appendiceal orifice, and rectum were                            photographed. Scope In: 7:41:08 AM Scope Out: 7:53:57 AM Scope Withdrawal Time: 0 hours 8 minutes 28 seconds  Total Procedure Duration: 0 hours 12 minutes 49 seconds  Findings:      The perianal and digital rectal examinations were normal.      Multiple small and large-mouthed diverticula were found in the sigmoid       colon, descending colon, splenic flexure, transverse colon, hepatic       flexure and ascending colon.      Internal hemorrhoids were found during retroflexion. The hemorrhoids       were small. Impression:               - Diverticulosis in the sigmoid colon, in the                            descending colon, at the splenic flexure, in the  transverse colon, at the hepatic flexure and in the                            ascending colon.                           - Internal hemorrhoids.                           - No specimens collected. Moderate Sedation:      Moderate (conscious) sedation was administered by the endoscopy nurse       and supervised by the endoscopist. The following parameters were       monitored: oxygen saturation, heart rate, blood pressure, CO2       capnography and response to care. Total physician intraservice time was       19 minutes. Recommendation:           - Patient has a contact number available  for                            emergencies. The signs and symptoms of potential                            delayed complications were discussed with the                            patient. Return to normal activities tomorrow.                            Written discharge instructions were provided to the                            patient.                           - High fiber diet and diabetic (ADA) diet today.                           - Continue present medications.                           - Repeat colonoscopy in 10 years for screening                            purposes. Procedure Code(s):        --- Professional ---                           [id number], Colonoscopy, flexible; diagnostic, including                            collection of specimen(s) by brushing or washing,                            when performed (separate procedure)  16109, Moderate sedation services provided by the                            same physician or other qualified health care                            professional performing the diagnostic or                            therapeutic service that the sedation supports,                            requiring the presence of an independent trained                            observer to assist in the monitoring of the                            patient's level of consciousness and physiological                            status; initial 15 minutes of intraservice time,                            patient age 85 years or older Diagnosis Code(s):        --- Professional ---                           Z12.11, Encounter for screening for malignant                            neoplasm of colon                           K64.8, Other hemorrhoids                           K57.30, Diverticulosis of large intestine without                            perforation or abscess without bleeding CPT copyright 2016 American Medical Association. All rights  reserved. The codes documented in this report are preliminary and upon coder review may  be revised to meet current compliance requirements. Lionel December, MD Lionel December, MD 12/09/2017 8:03:05 AM This report has been signed electronically. Number of Addenda: 0

## 2017-12-09 NOTE — H&P (Signed)
Amy Hendrix is an 67 y.o. female.   Chief Complaint: Patient is here for colonoscopy. HPI: She is 67 year old Caucasian female who is here for her first screening colonoscopy.  She denies abdominal pain she change in bowel habits or rectal bleeding. Family history significant for colonic polyps and 1 of her brothers.  He has had multiple colonic polyps.  He had 14 on his last exam which was 18 months after his prior exam.   Family history is negative for CRC.  Past Medical History:  Diagnosis Date  . Allergic rhinitis   . Arthritis   . Depression   . Hypertension   . Hypothyroid   . PONV (postoperative nausea and vomiting)    hard time waking up w/ nasal surgery  . Sarcoidosis   . Sinusitis   . Type 2 diabetes mellitus (HCC)     Past Surgical History:  Procedure Laterality Date  . APPENDECTOMY    . CESAREAN SECTION    . CHOLECYSTECTOMY    . EYE SURGERY Bilateral 2015   cataract surgery  . FOOT SURGERY Bilateral mid 1990's  . HAND SURGERY Left 2012   debridement/incision  . INSERTION OF MESH N/A 09/10/2017   Procedure: INSERTION OF MESH;  Surgeon: Jimmye Norman, MD;  Location: [facility] OR;  Service: General;  Laterality: N/A;  . NASAL SINUS SURGERY  1999  . UMBILICAL HERNIA REPAIR N/A 09/10/2017   Procedure: LAPAROSCOPIC UMBILICAL HERNIA;  Surgeon: Jimmye Norman, MD;  Location: [facility] OR;  Service: General;  Laterality: N/A;    History reviewed. No pertinent family history. Social History:  reports that  has never smoked. she has never used smokeless tobacco. She reports that she does not drink alcohol or use drugs.  Allergies: No Known Allergies  Medications Prior to Admission  Medication Sig Dispense Refill  . acetaminophen (TYLENOL) 650 MG CR tablet Take 1,300 mg by mouth every 8 (eight) hours as needed for pain.    . Aromatic Inhalants (VICKS VAPOR INHALER IN) Place 1 Inhaler into the nose as needed (nasal congestion).    Marland Kitchen atenolol (TENORMIN) 50 MG tablet Take 1 tablet (50  mg total) by mouth daily. 90 tablet 1  . atorvastatin (LIPITOR) 40 MG tablet Take 1 tablet (40 mg total) by mouth daily. 90 tablet 1  . colestipol (COLESTID) 1 g tablet Take 2 tablets (2 g total) by mouth daily. 180 tablet 1  . diphenhydrAMINE (BENADRYL) 25 mg capsule Take 25 mg by mouth at bedtime as needed for allergies.    Marland Kitchen escitalopram (LEXAPRO) 10 MG tablet Take 1 tablet (10 mg total) by mouth daily. 90 tablet 1  . glipiZIDE (GLUCOTROL) 5 MG tablet Take 1 tablet (5 mg total) by mouth daily before breakfast. 90 tablet 1  . indapamide (LOZOL) 2.5 MG tablet Take 1 tablet (2.5 mg total) by mouth daily. 90 tablet 1  . levothyroxine (SYNTHROID, LEVOTHROID) 100 MCG tablet TAKE 1 TABLET BEFORE BREAKFAST. (Patient taking differently: Take 100 mcg by mouth daily before breakfast. ) 90 tablet 1  . metFORMIN (GLUCOPHAGE) 500 MG tablet TAKE (1) TABLET BY MOUTH TWICE A DAY WITH MEALS (BREAKFAST AND SUPPER). (Patient taking differently: Take 500 mg by mouth 2 (two) times daily with a meal. ) 180 tablet 1  . neomycin-bacitracin-polymyxin (NEOSPORIN) ointment Apply 1 application topically as needed for wound care.     Marland Kitchen oxyCODONE (OXY IR/ROXICODONE) 5 MG immediate release tablet Take 1-2 tablets (5-10 mg total) by mouth every 6 (six) hours as needed  for moderate pain, severe pain or breakthrough pain. 20 tablet 0  . potassium chloride SA (K-DUR,KLOR-CON) 20 MEQ tablet Take 3 tablets (60 mEq total) by mouth daily. 270 tablet 1  . Pseudoephedrine HCl (SUDAFED 12 HOUR PO) Take 1 tablet by mouth daily as needed (allergies).    . benzonatate (TESSALON) 100 MG capsule Take 1 capsule (100 mg total) by mouth every 6 (six) hours as needed for cough. (Patient not taking: Reported on 09/02/2017) 30 capsule 1    No results found for this or any previous visit (from the past 48 hour(s)). No results found.  ROS  Blood pressure 121/61, resp. rate 14, SpO2 96 %. Physical Exam  Constitutional: She appears well-developed  and well-nourished.  HENT:  Mouth/Throat: Oropharynx is clear and moist.  Eyes: Conjunctivae are normal. No scleral icterus.  Neck: No thyromegaly present.  Cardiovascular: Normal rate, regular rhythm and normal heart sounds.  No murmur heard. Respiratory: Effort normal and breath sounds normal.  Small scar across upper sternum.  GI:  Abdomen is full.  Soft and nontender with organomegaly or masses.  Musculoskeletal: She exhibits no edema.  Lymphadenopathy:    She has no cervical adenopathy.  Neurological: She is alert.  Skin: Skin is warm and dry.     Assessment/Plan Average risk screening colonoscopy.  Lionel DecemberNajeeb Kanon Colunga, MD 12/09/2017, 7:31 AM

## 2017-12-09 NOTE — Discharge Instructions (Signed)
Resume usual medications as before. High-fiber diet. No driving for 24 hours. Next screening exam in 10 years.   Colonoscopy, Adult, Care After This sheet gives you information about how to care for yourself after your procedure. Your health care provider may also give you more specific instructions. If you have problems or questions, contact your health care provider. What can I expect after the procedure? After the procedure, it is common to have:  A small amount of blood in your stool for 24 hours after the procedure.  Some gas.  Mild abdominal cramping or bloating.  Follow these instructions at home: General instructions   For the first 24 hours after the procedure: ? Do not drive or use machinery. ? Do not sign important documents. ? Do not drink alcohol. ? Do your regular daily activities at a slower pace than normal. ? Eat soft, easy-to-digest foods. ? Rest often.  Take over-the-counter or prescription medicines only as told by your health care provider.  It is up to you to get the results of your procedure. Ask your health care provider, or the department performing the procedure, when your results will be ready. Relieving cramping and bloating  Try walking around when you have cramps or feel bloated.  Apply heat to your abdomen as told by your health care provider. Use a heat source that your health care provider recommends, such as a moist heat pack or a heating pad. ? Place a towel between your skin and the heat source. ? Leave the heat on for 20-30 minutes. ? Remove the heat if your skin turns bright red. This is especially important if you are unable to feel pain, heat, or cold. You may have a greater risk of getting burned. Eating and drinking  Drink enough fluid to keep your urine clear or pale yellow.  Resume your normal diet as instructed by your health care provider. Avoid heavy or fried foods that are hard to digest.  Avoid drinking alcohol for as long  as instructed by your health care provider. Contact a health care provider if:  You have blood in your stool 2-3 days after the procedure. Get help right away if:  You have more than a small spotting of blood in your stool.  You pass large blood clots in your stool.  Your abdomen is swollen.  You have nausea or vomiting.  You have a fever.  You have increasing abdominal pain that is not relieved with medicine. This information is not intended to replace advice given to you by your health care provider. Make sure you discuss any questions you have with your health care provider.   High-Fiber Diet Fiber, also called dietary fiber, is a type of carbohydrate found in fruits, vegetables, whole grains, and beans. A high-fiber diet can have many health benefits. Your health care provider may recommend a high-fiber diet to help:  Prevent constipation. Fiber can make your bowel movements more regular.  Lower your cholesterol.  Relieve hemorrhoids, uncomplicated diverticulosis, or irritable bowel syndrome.  Prevent overeating as part of a weight-loss plan.  Prevent heart disease, type 2 diabetes, and certain cancers.  What is my plan? The recommended daily intake of fiber includes:  38 grams for men under age 69.  30 grams for men over age 23.  25 grams for women under age 72.  21 grams for women over age 22.  You can get the recommended daily intake of dietary fiber by eating a variety of fruits, vegetables, grains,  and beans. Your health care provider may also recommend a fiber supplement if it is not possible to get enough fiber through your diet. What do I need to know about a high-fiber diet?  Fiber supplements have not been widely studied for their effectiveness, so it is better to get fiber through food sources.  Always check the fiber content on thenutrition facts label of any prepackaged food. Look for foods that contain at least 5 grams of fiber per serving.  Ask  your dietitian if you have questions about specific foods that are related to your condition, especially if those foods are not listed in the following section.  Increase your daily fiber consumption gradually. Increasing your intake of dietary fiber too quickly may cause bloating, cramping, or gas.  Drink plenty of water. Water helps you to digest fiber. What foods can I eat? Grains Whole-grain breads. Multigrain cereal. Oats and oatmeal. Brown rice. Barley. Bulgur wheat. Millet. Bran muffins. Popcorn. Rye wafer crackers. Vegetables Sweet potatoes. Spinach. Kale. Artichokes. Cabbage. Broccoli. Green peas. Carrots. Squash. Fruits Berries. Pears. Apples. Oranges. Avocados. Prunes and raisins. Dried figs. Meats and Other Protein Sources Navy, kidney, pinto, and soy beans. Split peas. Lentils. Nuts and seeds. Dairy Fiber-fortified yogurt. Beverages Fiber-fortified soy milk. Fiber-fortified orange juice. Other Fiber bars. The items listed above may not be a complete list of recommended foods or beverages. Contact your dietitian for more options. What foods are not recommended? Grains White bread. Pasta made with refined flour. White rice. Vegetables Fried potatoes. Canned vegetables. Well-cooked vegetables. Fruits Fruit juice. Cooked, strained fruit. Meats and Other Protein Sources Fatty cuts of meat. Fried Environmental education officer or fried fish. Dairy Milk. Yogurt. Cream cheese. Sour cream. Beverages Soft drinks. Other Cakes and pastries. Butter and oils. The items listed above may not be a complete list of foods and beverages to avoid. Contact your dietitian for more information. What are some tips for including high-fiber foods in my diet?  Eat a wide variety of high-fiber foods.  Make sure that half of all grains consumed each day are whole grains.  Replace breads and cereals made from refined flour or white flour with whole-grain breads and cereals.  Replace white rice with brown rice,  bulgur wheat, or millet.  Start the day with a breakfast that is high in fiber, such as a cereal that contains at least 5 grams of fiber per serving.  Use beans in place of meat in soups, salads, or pasta.  Eat high-fiber snacks, such as berries, raw vegetables, nuts, or popcorn. This information is not intended to replace advice given to you by your health care provider. Make sure you discuss any questions you have with your health care provider.   Diverticulosis Diverticulosis is a condition that develops when small pouches (diverticula) form in the wall of the large intestine (colon). The colon is where water is absorbed and stool is formed. The pouches form when the inside layer of the colon pushes through weak spots in the outer layers of the colon. You may have a few pouches or many of them. What are the causes? The cause of this condition is not known. What increases the risk? The following factors may make you more likely to develop this condition:  Being older than age 59. Your risk for this condition increases with age. Diverticulosis is rare among people younger than age 53. By age 77, many people have it.  Eating a low-fiber diet.  Having frequent constipation.  Being overweight.  Not  getting enough exercise.  Smoking.  Taking over-the-counter pain medicines, like aspirin and ibuprofen.  Having a family history of diverticulosis.  What are the signs or symptoms? In most people, there are no symptoms of this condition. If you do have symptoms, they may include:  Bloating.  Cramps in the abdomen.  Constipation or diarrhea.  Pain in the lower left side of the abdomen.  How is this diagnosed? This condition is most often diagnosed during an exam for other colon problems. Because diverticulosis usually has no symptoms, it often cannot be diagnosed independently. This condition may be diagnosed by:  Using a flexible scope to examine the colon  (colonoscopy).  Taking an X-ray of the colon after dye has been put into the colon (barium enema).  Doing a CT scan.  How is this treated? You may not need treatment for this condition if you have never developed an infection related to diverticulosis. If you have had an infection before, treatment may include:  Eating a high-fiber diet. This may include eating more fruits, vegetables, and grains.  Taking a fiber supplement.  Taking a live bacteria supplement (probiotic).  Taking medicine to relax your colon.  Taking antibiotic medicines.  Follow these instructions at home:  Drink 6-8 glasses of water or more each day to prevent constipation.  Try not to strain when you have a bowel movement.  If you have had an infection before: ? Eat more fiber as directed by your health care provider or your diet and nutrition specialist (dietitian). ? Take a fiber supplement or probiotic, if your health care provider approves.  Take over-the-counter and prescription medicines only as told by your health care provider.  If you were prescribed an antibiotic, take it as told by your health care provider. Do not stop taking the antibiotic even if you start to feel better.  Keep all follow-up visits as told by your health care provider. This is important. Contact a health care provider if:  You have pain in your abdomen.  You have bloating.  You have cramps.  You have not had a bowel movement in 3 days. Get help right away if:  Your pain gets worse.  Your bloating becomes very bad.  You have a fever or chills, and your symptoms suddenly get worse.  You vomit.  You have bowel movements that are bloody or black.  You have bleeding from your rectum. Summary  Diverticulosis is a condition that develops when small pouches (diverticula) form in the wall of the large intestine (colon).  You may have a few pouches or many of them.  This condition is most often diagnosed during an  exam for other colon problems.  If you have had an infection related to diverticulosis, treatment may include increasing the fiber in your diet, taking supplements, or taking medicines. This information is not intended to replace advice given to you by your health care provider. Make sure you discuss any questions you have with your health care provider. Document Released: 06/11/2004 Document Revised: 08/03/2016 Document Reviewed: 08/03/2016 Elsevier Interactive Patient Education  2017 [employer]  Hemorrhoids Hemorrhoids are swollen veins in and around the rectum or anus. There are two types of hemorrhoids:  Internal hemorrhoids. These occur in the veins that are just inside the rectum. They may poke through to the outside and become irritated and painful.  External hemorrhoids. These occur in the veins that are outside of the anus and can be felt as a painful swelling or hard  lump near the anus.  Most hemorrhoids do not cause serious problems, and they can be managed with home treatments such as diet and lifestyle changes. If home treatments do not help your symptoms, procedures can be done to shrink or remove the hemorrhoids. What are the causes? This condition is caused by increased pressure in the anal area. This pressure may result from various things, including:  Constipation.  Straining to have a bowel movement.  Diarrhea.  Pregnancy.  Obesity.  Sitting for long periods of time.  Heavy lifting or other activity that causes you to strain.  Anal sex.  What are the signs or symptoms? Symptoms of this condition include:  Pain.  Anal itching or irritation.  Rectal bleeding.  Leakage of stool (feces).  Anal swelling.  One or more lumps around the anus.  How is this diagnosed? This condition can often be diagnosed through a visual exam. Other exams or tests may also be done, such as:  Examination of the rectal area with a gloved hand (digital rectal  exam).  Examination of the anal canal using a small tube (anoscope).  A blood test, if you have lost a significant amount of blood.  A test to look inside the colon (sigmoidoscopy or colonoscopy).  How is this treated? This condition can usually be treated at home. However, various procedures may be done if dietary changes, lifestyle changes, and other home treatments do not help your symptoms. These procedures can help make the hemorrhoids smaller or remove them completely. Some of these procedures involve surgery, and others do not. Common procedures include:  Rubber band ligation. Rubber bands are placed at the base of the hemorrhoids to cut off the blood supply to them.  Sclerotherapy. Medicine is injected into the hemorrhoids to shrink them.  Infrared coagulation. A type of light energy is used to get rid of the hemorrhoids.  Hemorrhoidectomy surgery. The hemorrhoids are surgically removed, and the veins that supply them are tied off.  Stapled hemorrhoidopexy surgery. A circular stapling device is used to remove the hemorrhoids and use staples to cut off the blood supply to them.  Follow these instructions at home: Eating and drinking  Eat foods that have a lot of fiber in them, such as whole grains, beans, nuts, fruits, and vegetables. Ask your health care provider about taking products that have added fiber (fiber supplements).  Drink enough fluid to keep your urine clear or pale yellow. Managing pain and swelling  Take warm sitz baths for 20 minutes, 3-4 times a day to ease pain and discomfort.  If directed, apply ice to the affected area. Using ice packs between sitz baths may be helpful. ? Put ice in a plastic bag. ? Place a towel between your skin and the bag. ? Leave the ice on for 20 minutes, 2-3 times a day. General instructions  Take over-the-counter and prescription medicines only as told by your health care provider.  Use medicated creams or suppositories as  told.  Exercise regularly.  Go to the bathroom when you have the urge to have a bowel movement. Do not wait.  Avoid straining to have bowel movements.  Keep the anal area dry and clean. Use wet toilet paper or moist towelettes after a bowel movement.  Do not sit on the toilet for long periods of time. This increases blood pooling and pain. Contact a health care provider if:  You have increasing pain and swelling that are not controlled by treatment or medicine.  You  have uncontrolled bleeding.  You have difficulty having a bowel movement, or you are unable to have a bowel movement.  You have pain or inflammation outside the area of the hemorrhoids. This information is not intended to replace advice given to you by your health care provider. Make sure you discuss any questions you have with your health care provider. Document Released: 09/11/2000 Document Revised: 02/12/2016 Document Reviewed: 05/29/2015 Elsevier Interactive Patient Education  [employer]2018 [employer] Inc.

## 2017-12-15 ENCOUNTER — Encounter (HOSPITAL_COMMUNITY): Payer: Self-pay | Admitting: Internal Medicine

## 2018-02-26 ENCOUNTER — Other Ambulatory Visit: Payer: Self-pay | Admitting: Family Medicine

## 2018-03-28 ENCOUNTER — Other Ambulatory Visit: Payer: Self-pay | Admitting: Family Medicine

## 2018-04-19 ENCOUNTER — Encounter: Payer: Self-pay | Admitting: Family Medicine

## 2018-04-19 ENCOUNTER — Ambulatory Visit: Payer: Medicare Other | Admitting: Family Medicine

## 2018-04-19 VITALS — BP 132/92 | Ht 63.0 in | Wt <= 1120 oz

## 2018-04-19 DIAGNOSIS — E785 Hyperlipidemia, unspecified: Secondary | ICD-10-CM

## 2018-04-19 DIAGNOSIS — I10 Essential (primary) hypertension: Secondary | ICD-10-CM | POA: Diagnosis not present

## 2018-04-19 DIAGNOSIS — E119 Type 2 diabetes mellitus without complications: Secondary | ICD-10-CM | POA: Diagnosis not present

## 2018-04-19 DIAGNOSIS — Z79899 Other long term (current) drug therapy: Secondary | ICD-10-CM

## 2018-04-19 DIAGNOSIS — G8929 Other chronic pain: Secondary | ICD-10-CM

## 2018-04-19 DIAGNOSIS — E039 Hypothyroidism, unspecified: Secondary | ICD-10-CM | POA: Diagnosis not present

## 2018-04-19 DIAGNOSIS — D869 Sarcoidosis, unspecified: Secondary | ICD-10-CM

## 2018-04-19 DIAGNOSIS — M1711 Unilateral primary osteoarthritis, right knee: Secondary | ICD-10-CM

## 2018-04-19 LAB — POCT GLYCOSYLATED HEMOGLOBIN (HGB A1C): HEMOGLOBIN A1C: 5.6 % (ref 4.0–5.6)

## 2018-04-19 MED ORDER — ESCITALOPRAM OXALATE 10 MG PO TABS: 10.0000 mg | ORAL_TABLET | Freq: Every day | ORAL | 1 refills | Status: DC

## 2018-04-19 MED ORDER — GLIPIZIDE 5 MG PO TABS: 5.0000 mg | ORAL_TABLET | Freq: Every day | ORAL | 0 refills | Status: DC

## 2018-04-19 MED ORDER — POTASSIUM CHLORIDE CRYS ER 20 MEQ PO TBCR: 60.0000 meq | EXTENDED_RELEASE_TABLET | Freq: Every day | ORAL | 1 refills | Status: DC

## 2018-04-19 MED ORDER — ATENOLOL 50 MG PO TABS: 50.0000 mg | ORAL_TABLET | Freq: Every day | ORAL | 1 refills | Status: DC

## 2018-04-19 MED ORDER — METFORMIN HCL 500 MG PO TABS: 500.0000 mg | ORAL_TABLET | Freq: Two times a day (BID) | ORAL | 1 refills | Status: DC

## 2018-04-19 MED ORDER — LEVOTHYROXINE SODIUM 100 MCG PO TABS: 100.0000 ug | ORAL_TABLET | Freq: Every day | ORAL | 1 refills | Status: DC

## 2018-04-19 MED ORDER — ATORVASTATIN CALCIUM 40 MG PO TABS: 40.0000 mg | ORAL_TABLET | Freq: Every day | ORAL | 1 refills | Status: DC

## 2018-04-19 MED ORDER — INDAPAMIDE 2.5 MG PO TABS: 2.5000 mg | ORAL_TABLET | Freq: Every day | ORAL | 1 refills | Status: DC

## 2018-04-19 MED ORDER — COLESTIPOL HCL 1 G PO TABS: 2.0000 g | ORAL_TABLET | Freq: Every day | ORAL | 1 refills | Status: DC

## 2018-04-19 NOTE — Progress Notes (Signed)
Subjective:    Patient ID: Amy Hendrix, female    DOB: 02/05/1951, 67 y.o.   MRN: [id number] Patient arrives with numerous concerns  Diabetes   She presents for her follow-up diabetic visit. She has type 2 diabetes mellitus. There are no hypoglycemic associated symptoms. There are no diabetic associated symptoms. There are no hypoglycemic complications. There are no diabetic complications.   Patient claims compliance with diabetes medication. No obvious side effects. Reports no substantial low sugar spells. Most numbers are generally in good range when checked fasting. Generally does not miss a dose of medication. Watching diabetic diet closely  Results for orders placed or performed during the hospital encounter of 12/09/17  Glucose, capillary  Result Value Ref Range   Glucose-Capillary 122 (H) 65 - 99 mg/dL   5.6 % H8IA1c today  Blood pressure medicine and blood pressure levels reviewed today with patient. Compliant with blood pressure medicine. States does not miss a dose. No obvious side effects. Blood pressure generally good when checked elsewhere. Watching salt intake.   Patient continues to take lipid medication regularly. No obvious side effects from it. Generally does not miss a dose. Prior blood work results are reviewed with patient. Patient continues to work on fat intake in diet   Diet ovall summer  Patient notes ongoing compliance with antidepressant medication. No obvious side effects. Reports does not miss a dose. Overall continues to help depression substantially. No thoughts of homicide or suicide. Would like to maintain medication.   Pt notes right knee pain for the past week, with limping at times,   patient actually experiencing separate issues with both knees.  Patient fell and struck her left knee.  Occurred earlier today wearing a Band-Aid.  Patient reports progressive right knee pain.  No known injury.  History of dyspnea acting up in the past.  Patient has  history of sarcoidosis.  Very much is stopped going to the specialist for this.  Reports no excessive shortness of breath at this time    Review of Systems No headache, no major weight loss or weight gain, no chest pain no back pain abdominal pain no change in bowel habits complete ROS otherwise negative     Objective:   Physical Exam  Alert and oriented, vitals reviewed and stable, NAD ENT-TM's and ext canals WNL bilat via otoscopic exam Soft palate, tonsils and post pharynx WNL via oropharyngeal exam Neck-symmetric, no masses; thyroid nonpalpable and nontender Pulmonary-no tachypnea or accessory muscle use; Clear without wheezes via auscultation Card--no abnrml murmurs, rhythm reg and rate WNL Carotid pulses symmetric, without bruits   Right knee crepitations evident with extension slight effusion no joint laxity  Left knee anterior abrasion good range of motion no point tenderness   1 impression type 2 diabetes.  Control good discussed to maintain same approach #2  Depression clinically stable patient maintain same meds rationale discussed  3.  Hyperlipidemia.  Status uncertain.  Problem reviewed.  Patient to follow-up with current blood work.  Further recommendations based result  4.  Sarcoidosis clinic stable no change in respiratory status  5.  Hypothyroidism.  Status uncertain  6.  Progressive right knee pain with potential for substantial arthritis  7.  Left knee contusion  8 hypertension clinically stable to maintain same therapy  Greater than 50% of this 40 minute face to face visit was spent in counseling and discussion and coordination of care regarding the above diagnosis/diagnosies       Assessment & Plan:

## 2018-04-20 LAB — TSH: TSH: 8.47 u[IU]/mL — AB (ref 0.450–4.500)

## 2018-04-20 LAB — HEPATIC FUNCTION PANEL
ALK PHOS: 65 IU/L (ref 39–117)
ALT: 17 IU/L (ref 0–32)
AST: 15 IU/L (ref 0–40)
Albumin: 4.4 g/dL (ref 3.6–4.8)
BILIRUBIN, DIRECT: 0.15 mg/dL (ref 0.00–0.40)
Bilirubin Total: 0.6 mg/dL (ref 0.0–1.2)
Total Protein: 6.5 g/dL (ref 6.0–8.5)

## 2018-04-20 LAB — BASIC METABOLIC PANEL
BUN / CREAT RATIO: 16 (ref 12–28)
BUN: 22 mg/dL (ref 8–27)
CO2: 24 mmol/L (ref 20–29)
Calcium: 9.7 mg/dL (ref 8.7–10.3)
Chloride: 99 mmol/L (ref 96–106)
Creatinine, Ser: 1.39 mg/dL — ABNORMAL HIGH (ref 0.57–1.00)
GFR calc Af Amer: 45 mL/min/{1.73_m2} — ABNORMAL LOW (ref >59)
GFR calc non Af Amer: 39 mL/min/{1.73_m2} — ABNORMAL LOW (ref >59)
GLUCOSE: 170 mg/dL — AB (ref 65–99)
Potassium: 4.6 mmol/L (ref 3.5–5.2)
Sodium: 140 mmol/L (ref 134–144)

## 2018-04-20 LAB — LIPID PANEL
Chol/HDL Ratio: 3.7 ratio (ref 0.0–4.4)
Cholesterol, Total: 140 mg/dL (ref 100–199)
HDL: 38 mg/dL — ABNORMAL LOW (ref >39)
LDL Calculated: 71 mg/dL (ref 0–99)
TRIGLYCERIDES: 153 mg/dL — AB (ref 0–149)
VLDL Cholesterol Cal: 31 mg/dL (ref 5–40)

## 2018-04-22 ENCOUNTER — Other Ambulatory Visit: Payer: Self-pay

## 2018-04-22 DIAGNOSIS — I10 Essential (primary) hypertension: Secondary | ICD-10-CM

## 2018-04-22 DIAGNOSIS — E039 Hypothyroidism, unspecified: Secondary | ICD-10-CM

## 2018-04-22 MED ORDER — LEVOTHYROXINE SODIUM 125 MCG PO TABS: 125.0000 ug | ORAL_TABLET | Freq: Every day | ORAL | 1 refills | Status: DC

## 2018-04-28 MED ORDER — TRIAMCINOLONE ACETONIDE 0.1 % EX CREA: 1.0000 "application " | TOPICAL_CREAM | Freq: Two times a day (BID) | CUTANEOUS | 3 refills | Status: DC

## 2018-04-28 NOTE — Addendum Note (Signed)
Addended by: Meredith LeedsSUTTON, CRYSTAL L on: 04/28/2018 08:48 AM   Modules accepted: Orders

## 2018-07-13 ENCOUNTER — Encounter: Payer: Self-pay | Admitting: Family Medicine

## 2018-07-13 ENCOUNTER — Ambulatory Visit: Payer: Medicare Other | Admitting: Family Medicine

## 2018-07-13 VITALS — BP 138/88 | Ht 63.0 in | Wt 227.8 lb

## 2018-07-13 DIAGNOSIS — E785 Hyperlipidemia, unspecified: Secondary | ICD-10-CM

## 2018-07-13 DIAGNOSIS — Z79899 Other long term (current) drug therapy: Secondary | ICD-10-CM | POA: Diagnosis not present

## 2018-07-13 DIAGNOSIS — I10 Essential (primary) hypertension: Secondary | ICD-10-CM | POA: Diagnosis not present

## 2018-07-13 DIAGNOSIS — Z23 Encounter for immunization: Secondary | ICD-10-CM | POA: Diagnosis not present

## 2018-07-13 DIAGNOSIS — E039 Hypothyroidism, unspecified: Secondary | ICD-10-CM | POA: Diagnosis not present

## 2018-07-13 DIAGNOSIS — E119 Type 2 diabetes mellitus without complications: Secondary | ICD-10-CM

## 2018-07-13 LAB — POCT GLYCOSYLATED HEMOGLOBIN (HGB A1C): Hemoglobin A1C: 6.4 % — AB (ref 4.0–5.6)

## 2018-07-13 NOTE — Progress Notes (Signed)
   Subjective:    Patient ID: Amy Hendrix, female    DOB: Apr 14, 1951, 67 y.o.   MRN: [id number] Patient arrives office with substantial concerns Diabetes  She presents for her follow-up diabetic visit. She has type 2 diabetes mellitus. There are no hypoglycemic associated symptoms. There are no diabetic associated symptoms. There are no hypoglycemic complications. There are no diabetic complications. She does not see a podiatrist.Eye exam is not current.   Results for orders placed or performed in visit on 07/13/18  POCT HgB A1C  Result Value Ref Range   Hemoglobin A1C 6.4 (A) 4.0 - 5.6 %   HbA1c POC (<> result, manual entry)     HbA1c, POC (prediabetic range)     HbA1c, POC (controlled diabetic range)     Pt had a couplerelatives on daialysis, one due to cancer, and aunt who had kidneu issues   Patient notes ongoing compliance with antidepressant medication. No obvious side effects. Reports does not miss a dose. Overall continues to help depression substantially. No thoughts of homicide or suicide. Would like to maintain medication.  Patient continues to take lipid medication regularly. No obvious side effects from it. Generally does not miss a dose. Prior blood work results are reviewed with patient. Patient continues to work on fat intake in diet  Blood pressure medicine and blood pressure levels reviewed today with patient. Compliant with blood pressure medicine. States does not miss a dose. No obvious side effects. Blood pressure generally good when checked elsewhere. Watching salt intake.       Review of Systems No headache, no major weight loss or weight gain, no chest pain no back pain abdominal pain no change in bowel habits complete ROS otherwise negative     Objective:   Physical Exam  Alert and oriented, vitals reviewed and stable, NAD ENT-TM's and ext canals WNL bilat via otoscopic exam Soft palate, tonsils and post pharynx WNL via oropharyngeal  exam Neck-symmetric, no masses; thyroid nonpalpable and nontender Pulmonary-no tachypnea or accessory muscle use; Clear without wheezes via auscultation Card--no abnrml murmurs, rhythm reg and rate WNL Carotid pulses symmetric, without bruits       Assessment & Plan:  Impression type 2 diabetes.  Control good discussed to maintain same approach  2.  Hypertension blood pressure good discussed to maintain same meds compliance discussed salt intake discussed  3.  Depression clinically stable discussed  4.  Chronic renal disease stage III discussed.  Time to reevaluate  5.  Hypothyroidism.  Slight fatigue at times.  Status uncertain.  Will check blood work.  Appropriate blood work.  Flu shot today.  Medications refilled.  Diet exercise discussed.  Follow-up in 6 months or sooner if need be

## 2018-07-14 LAB — BASIC METABOLIC PANEL
BUN / CREAT RATIO: 14 (ref 12–28)
BUN: 17 mg/dL (ref 8–27)
CO2: 25 mmol/L (ref 20–29)
CREATININE: 1.23 mg/dL — AB (ref 0.57–1.00)
Calcium: 9.8 mg/dL (ref 8.7–10.3)
Chloride: 101 mmol/L (ref 96–106)
GFR calc Af Amer: 52 mL/min/{1.73_m2} — ABNORMAL LOW (ref >59)
GFR calc non Af Amer: 45 mL/min/{1.73_m2} — ABNORMAL LOW (ref >59)
GLUCOSE: 141 mg/dL — AB (ref 65–99)
POTASSIUM: 4.8 mmol/L (ref 3.5–5.2)
SODIUM: 143 mmol/L (ref 134–144)

## 2018-07-14 LAB — LIPID PANEL
CHOLESTEROL TOTAL: 139 mg/dL (ref 100–199)
Chol/HDL Ratio: 3.7 ratio (ref 0.0–4.4)
HDL: 38 mg/dL — ABNORMAL LOW (ref >39)
LDL CALC: 72 mg/dL (ref 0–99)
Triglycerides: 144 mg/dL (ref 0–149)
VLDL CHOLESTEROL CAL: 29 mg/dL (ref 5–40)

## 2018-07-14 LAB — HEPATIC FUNCTION PANEL
ALBUMIN: 4.4 g/dL (ref 3.6–4.8)
ALT: 20 IU/L (ref 0–32)
AST: 18 IU/L (ref 0–40)
Alkaline Phosphatase: 64 IU/L (ref 39–117)
BILIRUBIN TOTAL: 0.6 mg/dL (ref 0.0–1.2)
BILIRUBIN, DIRECT: 0.17 mg/dL (ref 0.00–0.40)
Total Protein: 6.4 g/dL (ref 6.0–8.5)

## 2018-07-14 LAB — TSH: TSH: 3.37 u[IU]/mL (ref 0.450–4.500)

## 2018-07-20 ENCOUNTER — Encounter: Payer: Self-pay | Admitting: Family Medicine

## 2018-07-26 ENCOUNTER — Other Ambulatory Visit: Payer: Self-pay | Admitting: Family Medicine

## 2018-08-19 ENCOUNTER — Other Ambulatory Visit: Payer: Self-pay | Admitting: Family Medicine

## 2018-09-23 ENCOUNTER — Other Ambulatory Visit: Payer: Self-pay | Admitting: Family Medicine

## 2018-10-07 ENCOUNTER — Other Ambulatory Visit: Payer: Self-pay | Admitting: Family Medicine

## 2018-10-20 ENCOUNTER — Ambulatory Visit: Payer: Medicare Other | Admitting: Family Medicine

## 2018-10-27 ENCOUNTER — Other Ambulatory Visit: Payer: Self-pay | Admitting: Family Medicine

## 2018-10-27 NOTE — Telephone Encounter (Signed)
Three mo both ok 

## 2018-11-23 ENCOUNTER — Ambulatory Visit: Payer: Medicare Other | Admitting: Family Medicine

## 2018-11-23 ENCOUNTER — Encounter: Payer: Self-pay | Admitting: Family Medicine

## 2018-11-23 VITALS — BP 132/82 | Ht 63.0 in | Wt 226.2 lb

## 2018-11-23 DIAGNOSIS — E785 Hyperlipidemia, unspecified: Secondary | ICD-10-CM | POA: Diagnosis not present

## 2018-11-23 DIAGNOSIS — I10 Essential (primary) hypertension: Secondary | ICD-10-CM

## 2018-11-23 DIAGNOSIS — E119 Type 2 diabetes mellitus without complications: Secondary | ICD-10-CM | POA: Diagnosis not present

## 2018-11-23 DIAGNOSIS — Z79899 Other long term (current) drug therapy: Secondary | ICD-10-CM

## 2018-11-23 DIAGNOSIS — F3341 Major depressive disorder, recurrent, in partial remission: Secondary | ICD-10-CM

## 2018-11-23 DIAGNOSIS — Z23 Encounter for immunization: Secondary | ICD-10-CM | POA: Diagnosis not present

## 2018-11-23 LAB — POCT GLYCOSYLATED HEMOGLOBIN (HGB A1C): Hemoglobin A1C: 5.4 % (ref 4.0–5.6)

## 2018-11-23 MED ORDER — POTASSIUM CHLORIDE CRYS ER 20 MEQ PO TBCR: 60.0000 meq | EXTENDED_RELEASE_TABLET | Freq: Every day | ORAL | 1 refills | Status: DC

## 2018-11-23 MED ORDER — TRIAMCINOLONE ACETONIDE 0.1 % EX CREA: 1.0000 "application " | TOPICAL_CREAM | Freq: Two times a day (BID) | CUTANEOUS | 3 refills | Status: DC

## 2018-11-23 MED ORDER — METFORMIN HCL 500 MG PO TABS: 500.0000 mg | ORAL_TABLET | Freq: Two times a day (BID) | ORAL | 1 refills | Status: DC

## 2018-11-23 MED ORDER — INDAPAMIDE 2.5 MG PO TABS: 2.5000 mg | ORAL_TABLET | Freq: Every day | ORAL | 1 refills | Status: DC

## 2018-11-23 MED ORDER — ATENOLOL 50 MG PO TABS: 50.0000 mg | ORAL_TABLET | Freq: Every day | ORAL | 1 refills | Status: DC

## 2018-11-23 MED ORDER — ZOSTER VAC RECOMB ADJUVANTED 50 MCG/0.5ML IM SUSR: 0.5000 mL | Freq: Once | INTRAMUSCULAR | 1 refills | Status: AC

## 2018-11-23 MED ORDER — COLESTIPOL HCL 1 G PO TABS: 2.0000 g | ORAL_TABLET | Freq: Every day | ORAL | 1 refills | Status: DC

## 2018-11-23 MED ORDER — ESCITALOPRAM OXALATE 10 MG PO TABS: ORAL_TABLET | ORAL | 0 refills | Status: DC

## 2018-11-23 MED ORDER — ATORVASTATIN CALCIUM 40 MG PO TABS: ORAL_TABLET | ORAL | 1 refills | Status: DC

## 2018-11-23 MED ORDER — LEVOTHYROXINE SODIUM 125 MCG PO TABS: ORAL_TABLET | ORAL | 1 refills | Status: DC

## 2018-11-23 NOTE — Progress Notes (Signed)
   Subjective:    Patient ID: Amy Hendrix, female    DOB: [birth year], 68 y.o.   MRN: [id number]  Diabetes  She presents for her follow-up diabetic visit. She has type 2 diabetes mellitus. Risk factors for coronary artery disease include diabetes mellitus, dyslipidemia and hypertension. Current diabetic treatment includes oral agent (monotherapy). She is compliant with treatment all of the time. Her weight is stable. She is following a diabetic diet.    Results for orders placed or performed in visit on 11/23/18  POCT glycosylated hemoglobin (Hb A1C)  Result Value Ref Range   Hemoglobin A1C 5.4 4.0 - 5.6 %   HbA1c POC (<> result, manual entry)     HbA1c, POC (prediabetic range)     HbA1c, POC (controlled diabetic range)      Patient would like to discuss coming off of her antidepressant. Patient would also like to get the shingles shot  Blood pressure medicine and blood pressure levels reviewed today with patient. Compliant with blood pressure medicine. States does not miss a dose. No obvious side effects. Blood pressure generally good when checked elsewhere. Watching salt intake.   Patient continues to take lipid medication regularly. No obvious side effects from it. Generally does not miss a dose. Prior blood work results are reviewed with patient. Patient continues to work on fat intake in diet  Pt overall feeling better mood wise and would like to get off the meds if at all possible  Review of Systems No headache, no major weight loss or weight gain, no chest pain no back pain abdominal pain no change in bowel habits complete ROS otherwise negative     Objective:   Physical Exam  Alert and oriented, vitals reviewed and stable, NAD ENT-TM's and ext canals WNL bilat via otoscopic exam Soft palate, tonsils and post pharynx WNL via oropharyngeal exam Neck-symmetric, no masses; thyroid nonpalpable and nontender Pulmonary-no tachypnea or accessory muscle use; Clear without  wheezes via auscultation Card--no abnrml murmurs, rhythm reg and rate WNL Carotid pulses symmetric, without bruits       Assessment & Plan:  Impression 1 type 2 diabetes.  Controlled to type.  Discussed.  Stop glipizide rationale discussed  2.  Hypertension.  Good control discussed maintain same meds  3.  Hyperlipidemia current status uncertain check appropriate blood work  4.  Depression.  Clinically improved patient would like to wean off medication switch to 1/2 tablet each morning for 2 weeks then every other morning for 2 weeks then stop  Follow-up in 6 months appropriate blood work medications refilled diet exercise discussed

## 2018-11-24 LAB — HEPATIC FUNCTION PANEL
ALT: 18 IU/L (ref 0–32)
AST: 17 IU/L (ref 0–40)
Albumin: 4.5 g/dL (ref 3.8–4.8)
Alkaline Phosphatase: 71 IU/L (ref 39–117)
Bilirubin Total: 0.7 mg/dL (ref 0.0–1.2)
Bilirubin, Direct: 0.22 mg/dL (ref 0.00–0.40)
Total Protein: 6.8 g/dL (ref 6.0–8.5)

## 2018-11-24 LAB — LIPID PANEL
CHOL/HDL RATIO: 3.7 ratio (ref 0.0–4.4)
Cholesterol, Total: 136 mg/dL (ref 100–199)
HDL: 37 mg/dL — ABNORMAL LOW (ref >39)
LDL CALC: 62 mg/dL (ref 0–99)
Triglycerides: 183 mg/dL — ABNORMAL HIGH (ref 0–149)
VLDL Cholesterol Cal: 37 mg/dL (ref 5–40)

## 2018-12-04 ENCOUNTER — Encounter: Payer: Self-pay | Admitting: Family Medicine

## 2019-01-10 ENCOUNTER — Telehealth: Payer: Self-pay | Admitting: Family Medicine

## 2019-01-10 NOTE — Telephone Encounter (Signed)
Tell her good news

## 2019-01-10 NOTE — Telephone Encounter (Signed)
Pt would like to give an update to Dr. Brett Canales. Since coming off her anti depressant she is feeling a ton better and has more energy. She wants to let him know how thankful she is!

## 2019-01-11 NOTE — Telephone Encounter (Signed)
Pt.notified

## 2019-04-18 ENCOUNTER — Telehealth: Payer: Self-pay | Admitting: Family Medicine

## 2019-04-18 DIAGNOSIS — E119 Type 2 diabetes mellitus without complications: Secondary | ICD-10-CM

## 2019-04-18 DIAGNOSIS — I10 Essential (primary) hypertension: Secondary | ICD-10-CM

## 2019-04-18 DIAGNOSIS — E785 Hyperlipidemia, unspecified: Secondary | ICD-10-CM

## 2019-04-18 DIAGNOSIS — Z79899 Other long term (current) drug therapy: Secondary | ICD-10-CM

## 2019-04-18 NOTE — Telephone Encounter (Signed)
Patient is requesting labs for her six month follow up 8/25

## 2019-04-18 NOTE — Telephone Encounter (Signed)
Labs orders placed and pt is aware 

## 2019-04-18 NOTE — Telephone Encounter (Signed)
Lip liv m7 A1c urine pro 

## 2019-04-18 NOTE — Telephone Encounter (Signed)
Last labs 11/23/18 lipid, liver, a1c

## 2019-05-18 LAB — BASIC METABOLIC PANEL
BUN/Creatinine Ratio: 14 (ref 12–28)
BUN: 17 mg/dL (ref 8–27)
CO2: 27 mmol/L (ref 20–29)
Calcium: 9.8 mg/dL (ref 8.7–10.3)
Chloride: 94 mmol/L — ABNORMAL LOW (ref 96–106)
Creatinine, Ser: 1.22 mg/dL — ABNORMAL HIGH (ref 0.57–1.00)
GFR calc Af Amer: 52 mL/min/{1.73_m2} — ABNORMAL LOW (ref >59)
GFR calc non Af Amer: 45 mL/min/{1.73_m2} — ABNORMAL LOW (ref >59)
Glucose: 330 mg/dL — ABNORMAL HIGH (ref 65–99)
Potassium: 4.1 mmol/L (ref 3.5–5.2)
Sodium: 137 mmol/L (ref 134–144)

## 2019-05-18 LAB — HEMOGLOBIN A1C
Est. average glucose Bld gHb Est-mCnc: 272 mg/dL
Hgb A1c MFr Bld: 11.1 % — ABNORMAL HIGH (ref 4.8–5.6)

## 2019-05-18 LAB — LIPID PANEL
Chol/HDL Ratio: 4 ratio (ref 0.0–4.4)
Cholesterol, Total: 140 mg/dL (ref 100–199)
HDL: 35 mg/dL — ABNORMAL LOW (ref >39)
LDL Calculated: 59 mg/dL (ref 0–99)
Triglycerides: 232 mg/dL — ABNORMAL HIGH (ref 0–149)
VLDL Cholesterol Cal: 46 mg/dL — ABNORMAL HIGH (ref 5–40)

## 2019-05-18 LAB — HEPATIC FUNCTION PANEL
ALT: 23 IU/L (ref 0–32)
AST: 20 IU/L (ref 0–40)
Albumin: 4.2 g/dL (ref 3.8–4.8)
Alkaline Phosphatase: 81 IU/L (ref 39–117)
Bilirubin Total: 0.8 mg/dL (ref 0.0–1.2)
Bilirubin, Direct: 0.26 mg/dL (ref 0.00–0.40)
Total Protein: 6.7 g/dL (ref 6.0–8.5)

## 2019-05-18 LAB — MICROALBUMIN / CREATININE URINE RATIO
Creatinine, Urine: 116.1 mg/dL
Microalb/Creat Ratio: 3 mg/g creat (ref 0–29)
Microalbumin, Urine: 3.6 ug/mL

## 2019-05-23 ENCOUNTER — Other Ambulatory Visit: Payer: Self-pay

## 2019-05-23 ENCOUNTER — Ambulatory Visit (INDEPENDENT_AMBULATORY_CARE_PROVIDER_SITE_OTHER): Payer: Medicare Other | Admitting: Family Medicine

## 2019-05-23 DIAGNOSIS — I10 Essential (primary) hypertension: Secondary | ICD-10-CM | POA: Diagnosis not present

## 2019-05-23 DIAGNOSIS — D869 Sarcoidosis, unspecified: Secondary | ICD-10-CM | POA: Diagnosis not present

## 2019-05-23 DIAGNOSIS — F3341 Major depressive disorder, recurrent, in partial remission: Secondary | ICD-10-CM

## 2019-05-23 DIAGNOSIS — E119 Type 2 diabetes mellitus without complications: Secondary | ICD-10-CM | POA: Diagnosis not present

## 2019-05-23 DIAGNOSIS — E785 Hyperlipidemia, unspecified: Secondary | ICD-10-CM | POA: Diagnosis not present

## 2019-05-23 DIAGNOSIS — E039 Hypothyroidism, unspecified: Secondary | ICD-10-CM

## 2019-05-23 MED ORDER — TRIAMCINOLONE ACETONIDE 0.1 % EX CREA: 1.0000 "application " | TOPICAL_CREAM | Freq: Two times a day (BID) | CUTANEOUS | 3 refills | Status: DC

## 2019-05-23 MED ORDER — METFORMIN HCL ER 500 MG PO TB24: ORAL_TABLET | ORAL | 1 refills | Status: DC

## 2019-05-23 MED ORDER — COLESTIPOL HCL 1 G PO TABS: 2.0000 g | ORAL_TABLET | Freq: Every day | ORAL | 1 refills | Status: DC

## 2019-05-23 MED ORDER — GLIPIZIDE 5 MG PO TABS: ORAL_TABLET | ORAL | 1 refills | Status: DC

## 2019-05-23 MED ORDER — CLONAZEPAM 1 MG PO TABS: ORAL_TABLET | ORAL | 0 refills | Status: DC

## 2019-05-23 MED ORDER — ATORVASTATIN CALCIUM 40 MG PO TABS: ORAL_TABLET | ORAL | 1 refills | Status: DC

## 2019-05-23 MED ORDER — POTASSIUM CHLORIDE CRYS ER 20 MEQ PO TBCR: 60.0000 meq | EXTENDED_RELEASE_TABLET | Freq: Every day | ORAL | 1 refills | Status: DC

## 2019-05-23 MED ORDER — INDAPAMIDE 2.5 MG PO TABS: 2.5000 mg | ORAL_TABLET | Freq: Every day | ORAL | 1 refills | Status: DC

## 2019-05-23 MED ORDER — BLOOD GLUCOSE METER KIT: PACK | 0 refills | Status: AC

## 2019-05-23 MED ORDER — LEVOTHYROXINE SODIUM 125 MCG PO TABS: ORAL_TABLET | ORAL | 1 refills | Status: DC

## 2019-05-23 MED ORDER — ATENOLOL 50 MG PO TABS: 50.0000 mg | ORAL_TABLET | Freq: Every day | ORAL | 1 refills | Status: DC

## 2019-05-23 NOTE — Progress Notes (Signed)
Subjective:  Audio only patient presents with numerous concerns  Patient ID: Amy Hendrix, female    DOB: Feb 10, 1951, 68 y.o.   MRN: [id number]  Diabetes She presents for her follow-up diabetic visit. She has type 2 diabetes mellitus. There are no hypoglycemic associated symptoms. There are no diabetic associated symptoms. There are no hypoglycemic complications. There are no diabetic complications. Compliance with diabetes treatment: pt doesnt check sugars often. She does not see a podiatrist.Eye exam is not current.   Pt states that she would like to switch her Metfomin to Metformin R. Pt states that the Metformin R is a time release and is said to help with loose bowels.   Results for orders placed or performed in visit on 04/18/19  Lipid Profile  Result Value Ref Range   Cholesterol, Total 140 100 - 199 mg/dL   Triglycerides 232 (H) 0 - 149 mg/dL   HDL 35 (L) >39 mg/dL   VLDL Cholesterol Cal 46 (H) 5 - 40 mg/dL   LDL Calculated 59 0 - 99 mg/dL   Chol/HDL Ratio 4.0 0.0 - 4.4 ratio  Hepatic function panel  Result Value Ref Range   Total Protein 6.7 6.0 - 8.5 g/dL   Albumin 4.2 3.8 - 4.8 g/dL   Bilirubin Total 0.8 0.0 - 1.2 mg/dL   Bilirubin, Direct 0.26 0.00 - 0.40 mg/dL   Alkaline Phosphatase 81 39 - 117 IU/L   AST 20 0 - 40 IU/L   ALT 23 0 - 32 IU/L  Basic Metabolic Panel (BMET)  Result Value Ref Range   Glucose 330 (H) 65 - 99 mg/dL   BUN 17 8 - 27 mg/dL   Creatinine, Ser 1.22 (H) 0.57 - 1.00 mg/dL   GFR calc non Af Amer 45 (L) >59 mL/min/1.73   GFR calc Af Amer 52 (L) >59 mL/min/1.73   BUN/Creatinine Ratio 14 12 - 28   Sodium 137 134 - 144 mmol/L   Potassium 4.1 3.5 - 5.2 mmol/L   Chloride 94 (L) 96 - 106 mmol/L   CO2 27 20 - 29 mmol/L   Calcium 9.8 8.7 - 10.3 mg/dL  Hemoglobin A1c  Result Value Ref Range   Hgb A1c MFr Bld 11.1 (H) 4.8 - 5.6 %   Est. average glucose Bld gHb Est-mCnc 272 mg/dL  Urine Microalbumin w/creat. ratio  Result Value Ref Range   Creatinine, Urine 116.1 Not Estab. mg/dL   Microalbumin, Urine 3.6 Not Estab. ug/mL   Microalb/Creat Ratio 3 0 - 29 mg/g creat    Pt would like something for her depression to use on a rare basis.   Virtual Visit via Video Note  I connected with Amy Hendrix on 05/23/19 at  9:00 AM EDT by a video enabled telemedicine application and verified that I am speaking with the correct person using two identifiers.  Location: Patient: home Provider: office   I discussed the limitations of evaluation and management by telemedicine and the availability of in person appointments. The patient expressed understanding and agreed to proceed.  History of Present Illness:    Observations/Objective:   Assessment and Plan:   Follow Up Instructions:    I discussed the assessment and treatment plan with the patient. The patient was provided an opportunity to ask questions and all were answered. The patient agreed with the plan and demonstrated an understanding of the instructions.   The patient was advised to call back or seek an in-person evaluation if the symptoms worsen or if  the condition fails to improve as anticipated.  I provided 30 minutes of non-face-to-face time during this encounter.   Amy Shoresanya  Manley, LPN  Unfortunately Amy Hendrix states she has not been checking her sugars today.  Very surprised that her A1c is 11 that her glucose is 330  Patient claims compliance with diabetes medication. No obvious side effects. Reports no substantial low sugar spells. Most numbers are generally in good range when checked fasting. Generally does not miss a dose of medication. Watching diabetic diet closely Amy Hendrix states she is taking her medicine faithfully.  Mostly watching her diet. Patient continues to take lipid medication regularly. No obvious side effects from it. Generally does not miss a dose. Prior blood work results are reviewed with patient. Patient continues to work on fat intake in diet  Blood pressure good when checked elsewhere.  Compliant with medications.   Blood pressure medicine and blood pressure levels reviewed today with patient. Compliant with blood pressure medicine. States does not miss a dose. No obvious side effects. Blood pressure generally good when checked elsewhere. Watching salt intake.   Pati patient notes ongoing stress.  Intermittent nature.  Needs an occasional nerve pill.  Does not want to go back on antidepressants at this time.  Claims no substantial depression just substantial family stress   Review of Systems No headache, no major weight loss or weight gain, no chest pain no back pain abdominal pain no change in bowel habits complete ROS otherwise negative     Objective:   Physical Exam  Virtual      Assessment & Plan:  Impression type 2 diabetes.  Poor control discussed change Metformin to once daily long-acting because of perceived GI side effects.  Reinitiate glipizide 5 mg p.o. twice daily.  Restart checking sugars extremely important  2.  Hypertension ongoing discussed maintain same meds  3.  Hyperlipidemia.  Ongoing discussed plan to maintain same meds  4.  Increased stress discussed will add as needed benzodiazepine.  Use sparingly potential side effects discussed  5.  Sarcoidosis clinically stable patient reports no worsening of symptoms  Call us in 1 month with blood work results  Recheck in 6 months

## 2019-06-30 ENCOUNTER — Telehealth: Payer: Self-pay | Admitting: Family Medicine

## 2019-06-30 NOTE — Telephone Encounter (Signed)
Patient dropped off her Glucose log.  Put in Dr. Jeannine Kitten folder.

## 2019-07-05 NOTE — Telephone Encounter (Signed)
Telephone call no answer 

## 2019-07-05 NOTE — Telephone Encounter (Signed)
Call pt, let her know I apologize but I saw glu's sheet then they disappeared, we get 100s of shets per week and it may have been shredded, ask pt over the phone to give you some numbers, document and send to me, thx

## 2019-07-07 NOTE — Telephone Encounter (Signed)
Get a pill s[plitter and incr the glucotrol to one and a half tab bid, adjust amnt to allow for, f u in five mo as sched, call before if any problems

## 2019-07-07 NOTE — Telephone Encounter (Signed)
When she started the med it was 323. For the whole month of October it is running 143-150 with her lowest number being 138 on 06/28/19

## 2019-07-07 NOTE — Telephone Encounter (Signed)
Telephone call no answer 

## 2019-07-13 ENCOUNTER — Other Ambulatory Visit: Payer: Self-pay

## 2019-07-13 ENCOUNTER — Other Ambulatory Visit (INDEPENDENT_AMBULATORY_CARE_PROVIDER_SITE_OTHER): Payer: Medicare Other

## 2019-07-13 DIAGNOSIS — Z23 Encounter for immunization: Secondary | ICD-10-CM | POA: Diagnosis not present

## 2019-07-13 MED ORDER — GLIPIZIDE 5 MG PO TABS: ORAL_TABLET | ORAL | 1 refills | Status: DC

## 2019-07-13 NOTE — Addendum Note (Signed)
Addended by: Vicente Males on: 07/13/2019 11:57 AM   Modules accepted: Orders

## 2019-07-13 NOTE — Telephone Encounter (Signed)
Ok go to one bid

## 2019-07-13 NOTE — Telephone Encounter (Signed)
Pt contacted and informed to take one glipizide bid. Pt verbalized understanding. Sent in refills to pharmacy

## 2019-07-13 NOTE — Telephone Encounter (Signed)
Called pt and she states she takes glipizide one daily currently but her med list states one bid. She states she never took one bid. Do you still want her to increase to one and a half bid?

## 2019-07-25 IMAGING — CR DG CHEST 2V
2 series · 2 of 2 positions shown · non-contrast
Comparison: None in PACs

CLINICAL DATA: Preoperative examination. History of sarcoidosis,
hypertension, and diabetes. Nonsmoker. No current complaints.

EXAM:
CHEST  2 VIEW

[w chest pa]
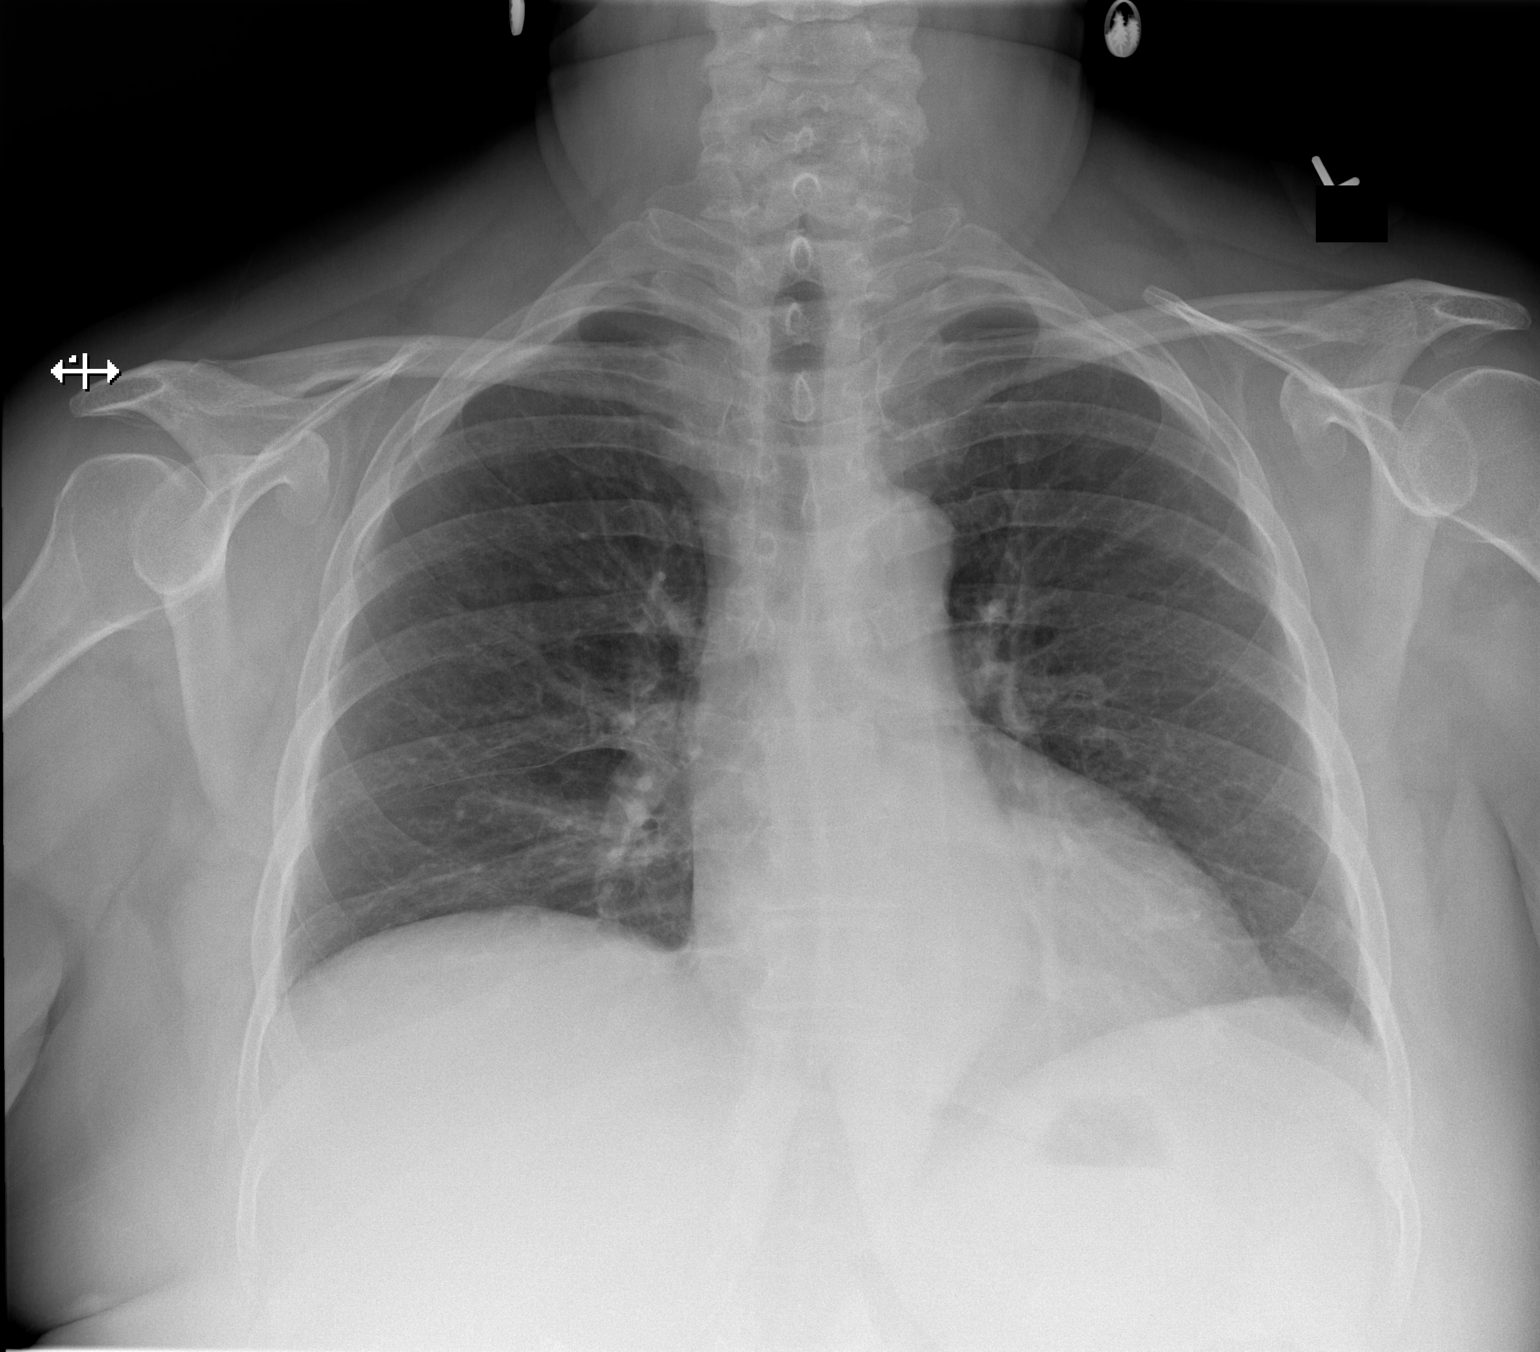

[w chest lat]
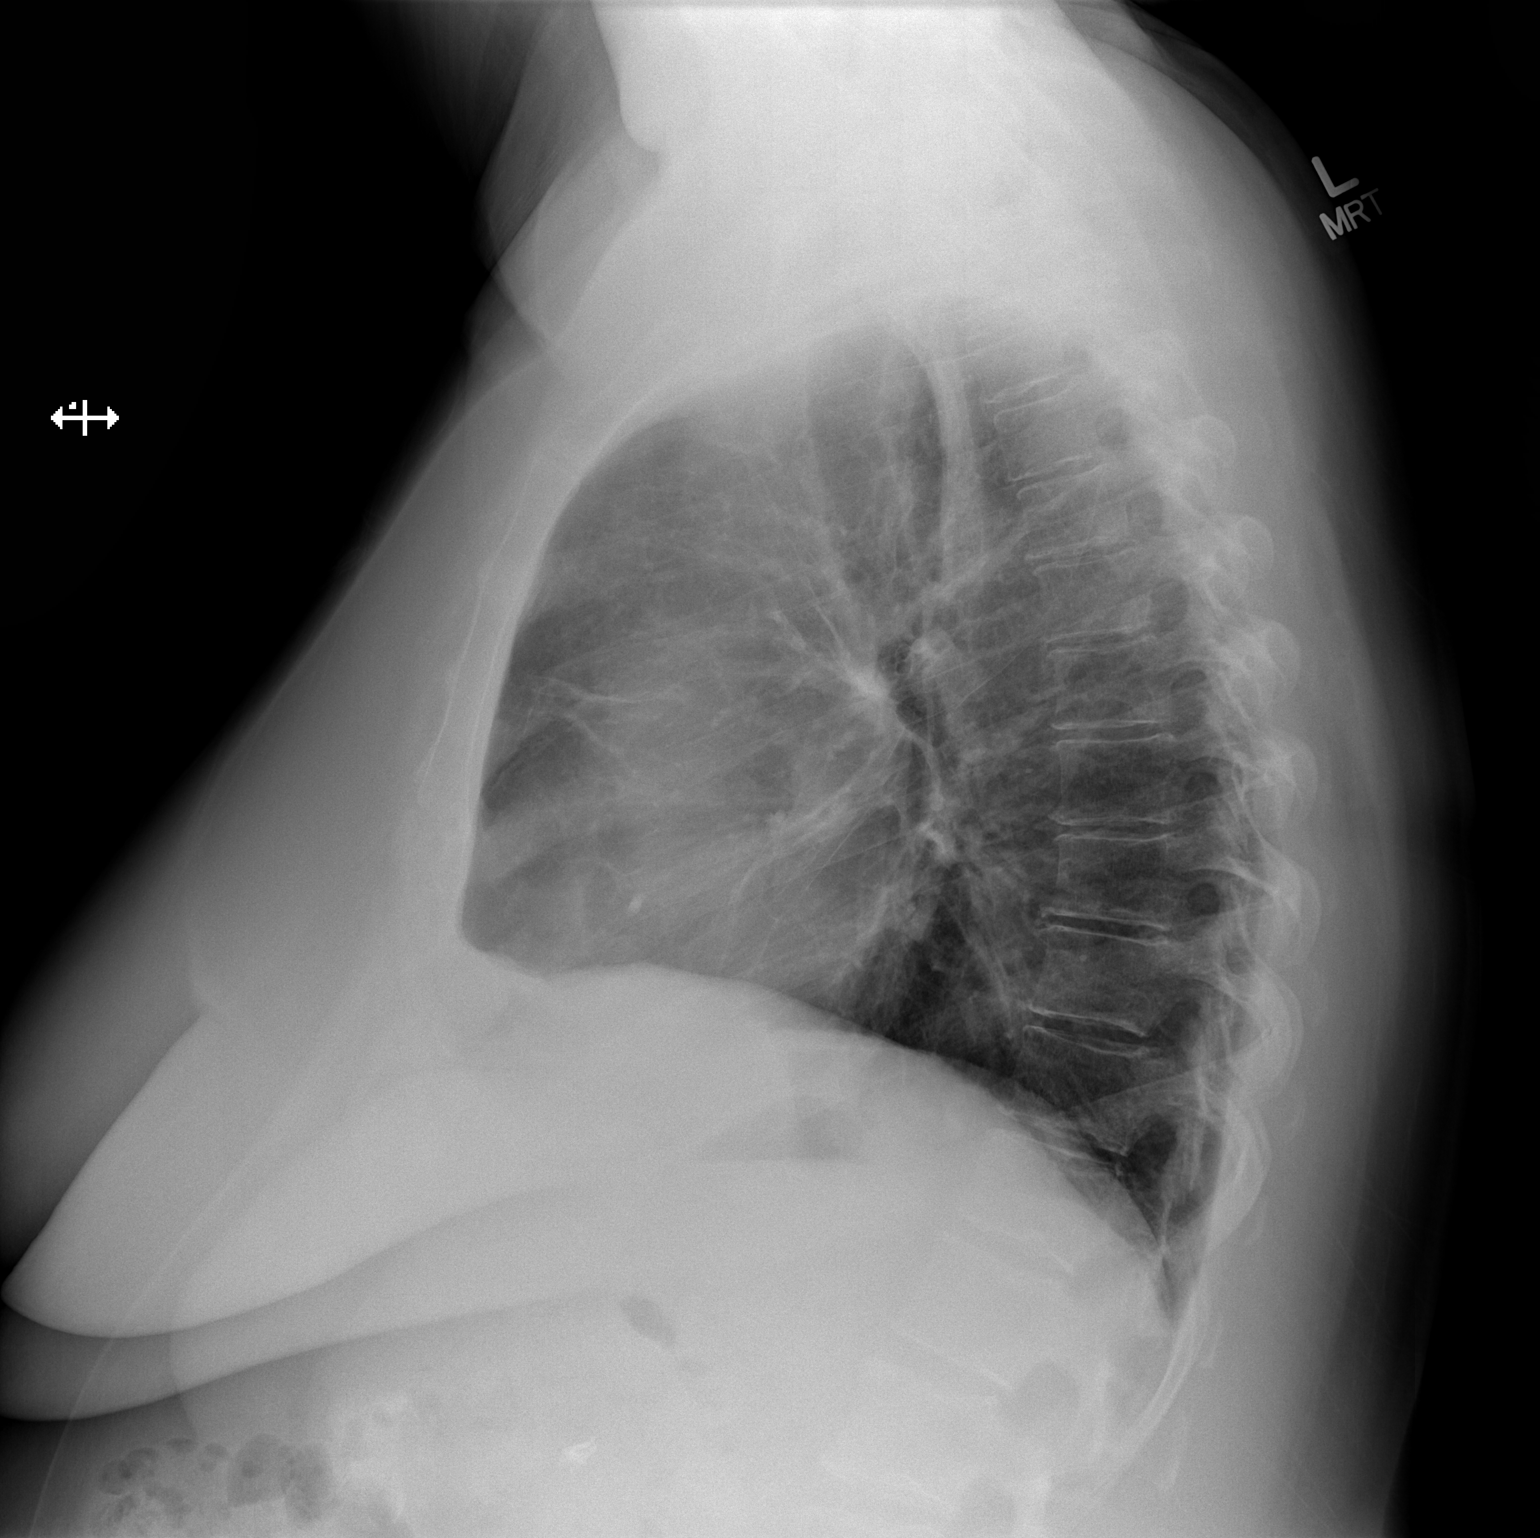

[2 of 2 positions shown; findings below may reference images not displayed]

FINDINGS: The lungs are adequately inflated and clear. The heart and pulmonary
vascularity are normal. The mediastinum is normal in width. There is
no pleural effusion. There is mild tortuosity of the descending
thoracic aorta. The observed bony thorax is unremarkable.
IMPRESSION: There is no active cardiopulmonary disease.

## 2019-08-29 ENCOUNTER — Other Ambulatory Visit: Payer: Self-pay | Admitting: Family Medicine

## 2019-10-24 ENCOUNTER — Other Ambulatory Visit: Payer: Self-pay | Admitting: Family Medicine

## 2019-11-01 ENCOUNTER — Other Ambulatory Visit: Payer: Self-pay | Admitting: Family Medicine

## 2019-11-01 DIAGNOSIS — E119 Type 2 diabetes mellitus without complications: Secondary | ICD-10-CM

## 2019-11-01 DIAGNOSIS — E785 Hyperlipidemia, unspecified: Secondary | ICD-10-CM

## 2019-11-01 DIAGNOSIS — I10 Essential (primary) hypertension: Secondary | ICD-10-CM

## 2019-11-01 DIAGNOSIS — Z79899 Other long term (current) drug therapy: Secondary | ICD-10-CM

## 2019-11-01 NOTE — Telephone Encounter (Signed)
Last labs completed on 05/17/2019 Urine microalbumin, Hgb A1C, BMET, Hepatic and lipid. Please advise. Thank you

## 2019-11-01 NOTE — Telephone Encounter (Signed)
Please call and schedule a visit and then route back to nurses to send in refill

## 2019-11-01 NOTE — Telephone Encounter (Signed)
Lip liv m7 A1c refill atorvastatin 90 d

## 2019-11-01 NOTE — Telephone Encounter (Signed)
Pt is scheduled virtual visit 4/14.  Pt would like to know if she needs to have lab work done before appt.   Also she wanted to make Dr. Brett Canales aware she got her first covid vaccine and is scheduled to get 2nd dose 2/11

## 2019-11-02 ENCOUNTER — Encounter: Payer: Self-pay | Admitting: Family Medicine

## 2019-11-06 ENCOUNTER — Ambulatory Visit: Payer: Medicare Other

## 2019-11-15 ENCOUNTER — Other Ambulatory Visit: Payer: Self-pay | Admitting: Family Medicine

## 2019-11-22 ENCOUNTER — Other Ambulatory Visit: Payer: Self-pay | Admitting: Family Medicine

## 2019-11-25 ENCOUNTER — Other Ambulatory Visit: Payer: Self-pay | Admitting: Family Medicine

## 2019-11-25 ENCOUNTER — Ambulatory Visit: Payer: Medicare Other

## 2019-12-26 ENCOUNTER — Other Ambulatory Visit: Payer: Self-pay | Admitting: Family Medicine

## 2019-12-26 NOTE — Telephone Encounter (Signed)
Last med check up 05/23/19 

## 2020-01-03 ENCOUNTER — Ambulatory Visit: Payer: Medicare PPO | Admitting: Family Medicine

## 2020-01-09 ENCOUNTER — Ambulatory Visit: Payer: Medicare Other | Admitting: Family Medicine

## 2020-01-10 ENCOUNTER — Other Ambulatory Visit: Payer: Self-pay

## 2020-01-10 ENCOUNTER — Ambulatory Visit (INDEPENDENT_AMBULATORY_CARE_PROVIDER_SITE_OTHER): Payer: Medicare PPO | Admitting: Family Medicine

## 2020-01-10 DIAGNOSIS — I10 Essential (primary) hypertension: Secondary | ICD-10-CM | POA: Diagnosis not present

## 2020-01-10 DIAGNOSIS — E119 Type 2 diabetes mellitus without complications: Secondary | ICD-10-CM | POA: Diagnosis not present

## 2020-01-10 DIAGNOSIS — Z79899 Other long term (current) drug therapy: Secondary | ICD-10-CM

## 2020-01-10 DIAGNOSIS — E039 Hypothyroidism, unspecified: Secondary | ICD-10-CM | POA: Diagnosis not present

## 2020-01-10 DIAGNOSIS — E785 Hyperlipidemia, unspecified: Secondary | ICD-10-CM | POA: Diagnosis not present

## 2020-01-10 DIAGNOSIS — Z8709 Personal history of other diseases of the respiratory system: Secondary | ICD-10-CM | POA: Diagnosis not present

## 2020-01-10 MED ORDER — LEVOTHYROXINE SODIUM 125 MCG PO TABS: ORAL_TABLET | ORAL | 1 refills | Status: DC

## 2020-01-10 MED ORDER — ATENOLOL 50 MG PO TABS: 50.0000 mg | ORAL_TABLET | Freq: Every day | ORAL | 1 refills | Status: DC

## 2020-01-10 MED ORDER — POTASSIUM CHLORIDE CRYS ER 20 MEQ PO TBCR: 60.0000 meq | EXTENDED_RELEASE_TABLET | Freq: Every day | ORAL | 1 refills | Status: DC

## 2020-01-10 MED ORDER — INDAPAMIDE 2.5 MG PO TABS: 2.5000 mg | ORAL_TABLET | Freq: Every day | ORAL | 1 refills | Status: DC

## 2020-01-10 MED ORDER — METFORMIN HCL ER 500 MG PO TB24: ORAL_TABLET | ORAL | 0 refills | Status: DC

## 2020-01-10 MED ORDER — GLIPIZIDE 5 MG PO TABS: ORAL_TABLET | ORAL | 1 refills | Status: DC

## 2020-01-10 MED ORDER — ATORVASTATIN CALCIUM 40 MG PO TABS: ORAL_TABLET | ORAL | 1 refills | Status: DC

## 2020-01-10 MED ORDER — CLONAZEPAM 1 MG PO TABS: ORAL_TABLET | ORAL | 5 refills | Status: DC

## 2020-01-10 NOTE — Progress Notes (Signed)
Subjective:  Audio plus video  Patient ID: Amy Hendrix, female    DOB: [birth year], 69 y.o.   MRN: [id number]  Diabetes She presents for her follow-up diabetic visit. She has type 2 diabetes mellitus. There are no hypoglycemic associated symptoms. There are no diabetic associated symptoms. There are no hypoglycemic complications. There are no diabetic complications. Risk factors for coronary artery disease include hypertension. (130-135; check blood sugar Monday, Wednesday and Friday.)  Hypertension This is a chronic problem. Risk factors for coronary artery disease include diabetes mellitus. There are no compliance problems.    Attempted to do PHQ-9 but pt is wanting to speak with provider. Pt states that provider has spoken to her about depression.   Virtual Visit via Telephone Note  I connected with Manson Allan on 01/10/20 at  9:00 AM EDT by telephone and verified that I am speaking with the correct person using two identifiers.  Location: Patient: home Provider: office   I discussed the limitations, risks, security and privacy concerns of performing an evaluation and management service by telephone and the availability of in person appointments. I also discussed with the patient that there may be a patient responsible charge related to this service. The patient expressed understanding and agreed to proceed.   History of Present Illness:    Observations/Objective:   Assessment and Plan:   Follow Up Instructions:    I discussed the assessment and treatment plan with the patient. The patient was provided an opportunity to ask questions and all were answered. The patient agreed with the plan and demonstrated an understanding of the instructions.   The patient was advised to call back or seek an in-person evaluation if the symptoms worsen or if the condition fails to improve as anticipated.  I provided 20 minutes of non-face-to-face time during this  encounter.   Patient claims compliance with diabetes medication. No obvious side effects. Reports no substantial low sugar spells. Most numbers are generally in good range when checked fasting. Generally does not miss a dose of medication. Watching diabetic diet closely  Patient continues to take lipid medication regularly. No obvious side effects from it. Generally does not miss a dose. Prior blood work results are reviewed with patient. Patient continues to work on fat intake in diet  Using prn sudafed  Blood pressure medicine and blood pressure levels reviewed today with patient. Compliant with blood pressure medicine. States does not miss a dose. No obvious side effects. Blood pressure generally good when checked elsewhere. Watching salt intake.   134 83   61  Breathing overall is stable with history of sarcoidosis.  Compliant with medication.  Walking some  Compliant with thyroid medicine.  No symptoms of high or low thyroid.  Has not been checked for over 1 year.   Chronic anxiety and stress with acute flare due to sun moving to [place] shortly  Review of Systems No headache no chest pain no shortness of breath    Objective:   Physical Exam   Virtual     Assessment & Plan:  Impression 1 type 2 diabetes likely good control discussed await A1c compliance discussed  2.  Hypothyroidism status uncertain await blood work  3.  Sarcoidosis clinically stable  4.  Hyperlipidemia.  Status uncertain needs blood work  5.  Hypertension good control discussed  6.  Stress ongoing will maintain as needed clonazepam patient did not want a daily medication  Medications refilled.  Diet exercise discussed.  Appropriate blood work.  Follow up in 6 months 

## 2020-01-11 DIAGNOSIS — E039 Hypothyroidism, unspecified: Secondary | ICD-10-CM | POA: Diagnosis not present

## 2020-01-11 DIAGNOSIS — Z79899 Other long term (current) drug therapy: Secondary | ICD-10-CM | POA: Diagnosis not present

## 2020-01-11 DIAGNOSIS — I10 Essential (primary) hypertension: Secondary | ICD-10-CM | POA: Diagnosis not present

## 2020-01-11 DIAGNOSIS — E785 Hyperlipidemia, unspecified: Secondary | ICD-10-CM | POA: Diagnosis not present

## 2020-01-11 DIAGNOSIS — E119 Type 2 diabetes mellitus without complications: Secondary | ICD-10-CM | POA: Diagnosis not present

## 2020-01-12 LAB — BASIC METABOLIC PANEL
BUN/Creatinine Ratio: 14 (ref 12–28)
BUN: 19 mg/dL (ref 8–27)
CO2: 26 mmol/L (ref 20–29)
Calcium: 9.8 mg/dL (ref 8.7–10.3)
Chloride: 102 mmol/L (ref 96–106)
Creatinine, Ser: 1.39 mg/dL — ABNORMAL HIGH (ref 0.57–1.00)
GFR calc Af Amer: 44 mL/min/{1.73_m2} — ABNORMAL LOW (ref >59)
GFR calc non Af Amer: 38 mL/min/{1.73_m2} — ABNORMAL LOW (ref >59)
Glucose: 143 mg/dL — ABNORMAL HIGH (ref 65–99)
Potassium: 5.4 mmol/L — ABNORMAL HIGH (ref 3.5–5.2)
Sodium: 141 mmol/L (ref 134–144)

## 2020-01-12 LAB — HEPATIC FUNCTION PANEL
ALT: 21 IU/L (ref 0–32)
AST: 22 IU/L (ref 0–40)
Albumin: 4.2 g/dL (ref 3.8–4.8)
Alkaline Phosphatase: 79 IU/L (ref 39–117)
Bilirubin Total: 0.6 mg/dL (ref 0.0–1.2)
Bilirubin, Direct: 0.22 mg/dL (ref 0.00–0.40)
Total Protein: 6.5 g/dL (ref 6.0–8.5)

## 2020-01-12 LAB — LIPID PANEL
Chol/HDL Ratio: 3.6 ratio (ref 0.0–4.4)
Cholesterol, Total: 125 mg/dL (ref 100–199)
HDL: 35 mg/dL — ABNORMAL LOW (ref >39)
LDL Chol Calc (NIH): 64 mg/dL (ref 0–99)
Triglycerides: 150 mg/dL — ABNORMAL HIGH (ref 0–149)
VLDL Cholesterol Cal: 26 mg/dL (ref 5–40)

## 2020-01-12 LAB — HEMOGLOBIN A1C
Est. average glucose Bld gHb Est-mCnc: 134 mg/dL
Hgb A1c MFr Bld: 6.3 % — ABNORMAL HIGH (ref 4.8–5.6)

## 2020-01-12 LAB — TSH: TSH: 1 u[IU]/mL (ref 0.450–4.500)

## 2020-01-15 ENCOUNTER — Other Ambulatory Visit: Payer: Self-pay | Admitting: *Deleted

## 2020-01-15 DIAGNOSIS — I10 Essential (primary) hypertension: Secondary | ICD-10-CM

## 2020-01-15 DIAGNOSIS — Z79899 Other long term (current) drug therapy: Secondary | ICD-10-CM

## 2020-05-07 ENCOUNTER — Other Ambulatory Visit: Payer: Self-pay | Admitting: Family Medicine

## 2020-05-07 DIAGNOSIS — E119 Type 2 diabetes mellitus without complications: Secondary | ICD-10-CM

## 2020-05-07 DIAGNOSIS — I10 Essential (primary) hypertension: Secondary | ICD-10-CM

## 2020-05-07 NOTE — Telephone Encounter (Signed)
Needs labs rechecked, kidney function elevated last few times.  Have pt get cmp and a1c and f/u in office.  (fasting) in next 30 days. Only given 30 day supply on meds.   Dr. Ladona Ridgel

## 2020-05-07 NOTE — Telephone Encounter (Signed)
Please schedule visit and then route back to nurses to do bw orders

## 2020-05-07 NOTE — Telephone Encounter (Signed)
Last diabetic check up 01/10/20. Last labs lipid, tsh, liver, bmp, a1c on 01/11/20

## 2020-05-08 NOTE — Telephone Encounter (Signed)
Lab orders placed.  

## 2020-05-08 NOTE — Telephone Encounter (Signed)
Scheduled 9/8 and made aware about lab work

## 2020-05-08 NOTE — Addendum Note (Signed)
Addended by: Marlowe Shores on: 05/08/2020 10:31 AM   Modules accepted: Orders

## 2020-05-29 LAB — COMPREHENSIVE METABOLIC PANEL
ALT: 21 IU/L (ref 0–32)
AST: 21 IU/L (ref 0–40)
Albumin/Globulin Ratio: 1.9 (ref 1.2–2.2)
Albumin: 4.4 g/dL (ref 3.7–4.7)
Alkaline Phosphatase: 75 IU/L (ref 48–121)
BUN/Creatinine Ratio: 20 (ref 12–28)
BUN: 25 mg/dL (ref 8–27)
Bilirubin Total: 0.6 mg/dL (ref 0.0–1.2)
CO2: 29 mmol/L (ref 20–29)
Calcium: 10.2 mg/dL (ref 8.7–10.3)
Chloride: 100 mmol/L (ref 96–106)
Creatinine, Ser: 1.26 mg/dL — ABNORMAL HIGH (ref 0.57–1.00)
GFR calc Af Amer: 50 mL/min/{1.73_m2} — ABNORMAL LOW (ref >59)
GFR calc non Af Amer: 43 mL/min/{1.73_m2} — ABNORMAL LOW (ref >59)
Globulin, Total: 2.3 g/dL (ref 1.5–4.5)
Glucose: 172 mg/dL — ABNORMAL HIGH (ref 65–99)
Potassium: 4.4 mmol/L (ref 3.5–5.2)
Sodium: 142 mmol/L (ref 134–144)
Total Protein: 6.7 g/dL (ref 6.0–8.5)

## 2020-05-29 LAB — HEMOGLOBIN A1C
Est. average glucose Bld gHb Est-mCnc: 163 mg/dL
Hgb A1c MFr Bld: 7.3 % — ABNORMAL HIGH (ref 4.8–5.6)

## 2020-06-05 ENCOUNTER — Ambulatory Visit: Payer: Medicare PPO | Admitting: Family Medicine

## 2020-06-18 ENCOUNTER — Encounter: Payer: Self-pay | Admitting: Family Medicine

## 2020-06-18 ENCOUNTER — Ambulatory Visit: Payer: Medicare PPO | Admitting: Family Medicine

## 2020-06-18 ENCOUNTER — Other Ambulatory Visit: Payer: Self-pay

## 2020-06-18 VITALS — BP 134/78 | HR 71 | Temp 96.8°F | Ht 63.0 in | Wt 217.6 lb

## 2020-06-18 DIAGNOSIS — I10 Essential (primary) hypertension: Secondary | ICD-10-CM | POA: Diagnosis not present

## 2020-06-18 DIAGNOSIS — E119 Type 2 diabetes mellitus without complications: Secondary | ICD-10-CM | POA: Diagnosis not present

## 2020-06-18 DIAGNOSIS — F32A Depression, unspecified: Secondary | ICD-10-CM

## 2020-06-18 DIAGNOSIS — F329 Major depressive disorder, single episode, unspecified: Secondary | ICD-10-CM

## 2020-06-18 DIAGNOSIS — E785 Hyperlipidemia, unspecified: Secondary | ICD-10-CM | POA: Diagnosis not present

## 2020-06-18 DIAGNOSIS — Z79899 Other long term (current) drug therapy: Secondary | ICD-10-CM

## 2020-06-18 DIAGNOSIS — Z23 Encounter for immunization: Secondary | ICD-10-CM

## 2020-06-18 DIAGNOSIS — F419 Anxiety disorder, unspecified: Secondary | ICD-10-CM

## 2020-06-18 DIAGNOSIS — E039 Hypothyroidism, unspecified: Secondary | ICD-10-CM

## 2020-06-18 MED ORDER — LEVOTHYROXINE SODIUM 125 MCG PO TABS: ORAL_TABLET | ORAL | 1 refills | Status: DC

## 2020-06-18 MED ORDER — CLONAZEPAM 1 MG PO TABS: ORAL_TABLET | ORAL | 2 refills | Status: DC

## 2020-06-18 MED ORDER — POTASSIUM CHLORIDE CRYS ER 20 MEQ PO TBCR: 20.0000 meq | EXTENDED_RELEASE_TABLET | Freq: Every day | ORAL | 1 refills | Status: DC

## 2020-06-18 MED ORDER — ESCITALOPRAM OXALATE 10 MG PO TABS: 10.0000 mg | ORAL_TABLET | Freq: Every day | ORAL | 5 refills | Status: DC

## 2020-06-18 MED ORDER — METFORMIN HCL 500 MG PO TABS
500.0000 mg | ORAL_TABLET | Freq: Two times a day (BID) | ORAL | 1 refills | Status: DC
Start: 2020-06-18 — End: 2020-10-24

## 2020-06-18 MED ORDER — GLIPIZIDE 5 MG PO TABS: ORAL_TABLET | ORAL | 1 refills | Status: DC

## 2020-06-18 MED ORDER — INDAPAMIDE 2.5 MG PO TABS
2.5000 mg | ORAL_TABLET | Freq: Every day | ORAL | 1 refills | Status: DC
Start: 2020-06-18 — End: 2021-02-25

## 2020-06-18 MED ORDER — METFORMIN HCL ER 500 MG PO TB24: ORAL_TABLET | ORAL | 1 refills | Status: DC

## 2020-06-18 NOTE — Progress Notes (Signed)
Patient ID: Amy Hendrix, female    DOB: 05-22-1951, 69 y.o.   MRN: [id number]   Chief Complaint  Patient presents with  . Establish Care  . Depression   Subjective:    HPI Pt here to establish care with provider.  Pt states her son moved to [place] and she is not dealing with it well. Pt states she was on meds a while ago and was told she could go back on if she needed.  Pt see for follow up.  Son recently moved and mother died and problems with depression. And went on meds and worked.  Then tapered off. Then doing well and now feeling tearful. Was taking clonazepam prn for stress.  Told her last 12/20, moved in may '21. Was on lexapro 14m daily in 2017-2020. Dr. SRichardson Landryhelped her son and was sick and he treated him, for rare infection.  Noticed her a1c more elevated. Not eating as well.  More sweets snacks and sodas.  Kidney function- Cr at 1.2. Has history of arthritis pain and not on nsaids.  2008- severe sprain in both, more swelling in rt ankle. No tenderness today or redness. Not tender to touch.  Medical History Brienne has a past medical history of Allergic rhinitis, Arthritis, Depression, Hypertension, Hypothyroid, PONV (postoperative nausea and vomiting), Sarcoidosis, Sinusitis, and Type 2 diabetes mellitus ([place].   Outpatient Encounter Medications as of 06/18/2020  Medication Sig  . acetaminophen (TYLENOL) 650 MG CR tablet Take 1,300 mg by mouth every 8 (eight) hours as needed for pain.  . Aromatic Inhalants (VICKS VAPOR INHALER IN) Place 1 Inhaler into the nose as needed (nasal congestion).  .Marland Kitchenatenolol (TENORMIN) 50 MG tablet Take 1 tablet (50 mg total) by mouth daily.  .Marland Kitchenatorvastatin (LIPITOR) 40 MG tablet TAKE (1) TABLET BY MOUTH ONCE DAILY.  . blood glucose meter kit and supplies Use to test blood sugar once a day. Please dispense based on pt insurance preference. DX: E11.9  . clonazePAM (KLONOPIN) 1 MG tablet Take 1/2-1 tablet po q day  prn stress   . colestipol (COLESTID) 1 g tablet TAKE 2 TABLETS DAILY.  . diphenhydrAMINE (BENADRYL) 25 mg capsule Take 25 mg by mouth at bedtime as needed for allergies.  .Marland KitchenglipiZIDE (GLUCOTROL) 5 MG tablet Take one tablet po BID  . indapamide (LOZOL) 2.5 MG tablet Take 1 tablet (2.5 mg total) by mouth daily.  . Lancets (ONETOUCH DELICA PLUS [id number] MISC USE TO TEST BLOOD SUGAR ONCE DAILY AS DIRECTED.  .Marland Kitchenlevothyroxine (SYNTHROID) 125 MCG tablet TAKE 1 TABLET BEFORE BREAKFAST.  . metFORMIN (GLUCOPHAGE) 500 MG tablet Take 1 tablet (500 mg total) by mouth 2 (two) times daily with a meal.  . metFORMIN (GLUCOPHAGE-XR) 500 MG 24 hr tablet TAKE (2) TABLETS BY MOUTH EACH MORNING.  .Marland Kitchenneomycin-bacitracin-polymyxin (NEOSPORIN) ointment Apply 1 application topically as needed for wound care.   .Glory RosebushULTRA test strip USE TO TEST BLOOD SUGAR ONCE DAILY.  .Marland KitchenoxyCODONE (OXY IR/ROXICODONE) 5 MG immediate release tablet Take 1-2 tablets (5-10 mg total) by mouth every 6 (six) hours as needed for moderate pain, severe pain or breakthrough pain.  . potassium chloride SA (KLOR-CON) 20 MEQ tablet Take 1 tablet (20 mEq total) by mouth daily.  . Pseudoephedrine HCl (SUDAFED 12 HOUR PO) Take 1 tablet by mouth daily as needed (allergies).  . triamcinolone cream (KENALOG) 0.1 % Apply 1 application topically 2 (two) times daily.  . [DISCONTINUED] clonazePAM (KLONOPIN) 1 MG tablet  Take 1/2-1 tablet po q day  prn stress  . [DISCONTINUED] glipiZIDE (GLUCOTROL) 5 MG tablet Take one tablet po BID  . [DISCONTINUED] indapamide (LOZOL) 2.5 MG tablet Take 1 tablet (2.5 mg total) by mouth daily.  . [DISCONTINUED] levothyroxine (SYNTHROID) 125 MCG tablet TAKE 1 TABLET BEFORE BREAKFAST.  . [DISCONTINUED] metFORMIN (GLUCOPHAGE) 500 MG tablet Take 1 tablet (500 mg total) by mouth 2 (two) times daily with a meal.  . [DISCONTINUED] metFORMIN (GLUCOPHAGE-XR) 500 MG 24 hr tablet TAKE (2) TABLETS BY MOUTH EACH MORNING.  . [DISCONTINUED] potassium  chloride SA (KLOR-CON) 20 MEQ tablet Take 3 tablets (60 mEq total) by mouth daily.  Marland Kitchen escitalopram (LEXAPRO) 10 MG tablet Take 1 tablet (10 mg total) by mouth daily.   No facility-administered encounter medications on file as of 06/18/2020.     Review of Systems  Constitutional: Negative for chills and fever.  HENT: Negative for congestion, rhinorrhea and sore throat.   Respiratory: Negative for cough, shortness of breath and wheezing.   Cardiovascular: Negative for chest pain and leg swelling.  Gastrointestinal: Negative for abdominal pain, diarrhea, nausea and vomiting.  Genitourinary: Negative for dysuria and frequency.  Musculoskeletal: Negative for arthralgias and back pain.  Skin: Negative for rash.  Neurological: Negative for dizziness, weakness and headaches.  Psychiatric/Behavioral: Positive for dysphoric mood. Negative for self-injury, sleep disturbance and suicidal ideas. The patient is nervous/anxious.      Vitals BP 134/78   Pulse 71   Temp (!) 96.8 F (36 C)   Ht 5' 3"  (1.6 m)   Wt 217 lb 9.6 oz (98.7 kg)   SpO2 99%   BMI 38.55 kg/m   Objective:   Physical Exam Vitals and nursing note reviewed.  Constitutional:      Appearance: Normal appearance.  HENT:     Head: Normocephalic and atraumatic.     Nose: Nose normal.     Mouth/Throat:     Mouth: Mucous membranes are moist.     Pharynx: Oropharynx is clear.  Eyes:     Extraocular Movements: Extraocular movements intact.     Conjunctiva/sclera: Conjunctivae normal.     Pupils: Pupils are equal, round, and reactive to light.  Cardiovascular:     Rate and Rhythm: Normal rate and regular rhythm.     Pulses: Normal pulses.     Heart sounds: Normal heart sounds.  Pulmonary:     Effort: Pulmonary effort is normal.     Breath sounds: Normal breath sounds. No wheezing, rhonchi or rales.  Musculoskeletal:        General: Normal range of motion.     Right lower leg: No edema.     Left lower leg: No edema.   Skin:    General: Skin is warm and dry.     Findings: No lesion or rash.  Neurological:     General: No focal deficit present.     Mental Status: She is alert and oriented to person, place, and time.  Psychiatric:        Mood and Affect: Mood normal.        Behavior: Behavior normal.     Comments: +tearful on exam.      Assessment and Plan   1. Diabetes mellitus without complication (HCC) - Microalbumin, urine - CBC with Differential - Comprehensive Metabolic Panel (CMET) - Hemoglobin A1c - TSH - Lipid Profile  2. Need for vaccination - Flu Vaccine QUAD High Dose(Fluad)  3. Essential hypertension, benign - CBC with Differential -  Comprehensive Metabolic Panel (CMET) - Hemoglobin A1c - TSH - Lipid Profile  4. High risk medication use - CBC with Differential - Comprehensive Metabolic Panel (CMET) - Hemoglobin A1c - TSH - Lipid Profile  5. Hyperlipidemia, unspecified hyperlipidemia type - CBC with Differential - Comprehensive Metabolic Panel (CMET) - Hemoglobin A1c - TSH - Lipid Profile  6. Hypothyroidism, unspecified type - CBC with Differential - Comprehensive Metabolic Panel (CMET) - Hemoglobin A1c - TSH - Lipid Profile  7. Depression, unspecified depression type - escitalopram (LEXAPRO) 10 MG tablet; Take 1 tablet (10 mg total) by mouth daily.  Dispense: 30 tablet; Refill: 5  8. Anxiety - clonazePAM (KLONOPIN) 1 MG tablet; Take 1/2-1 tablet po q day  prn stress  Dispense: 20 tablet; Refill: 2    H/o sarcodosis- no residual damage. Thinking it is "cured." And not seeing any remaining problems. Didn't do any steroids or other treatment for it.  Depression/anxiety-  Worsening.  will restart the lexapro 9m.  Cont with klonapin prn.  Elevated Cr- at 1.26.   DM2- suboptimal.  elevated a1c, at 7.3, lat visit 6.3.  Advising watching diet and inc in exercising.  Cont meds.  htn- stable, suboptimal.  Cont to dec salt in diet, cont meds.  HLD-  stable, cont meds.  Hypothyroid- cont meds. Stable.  F/u in 335mor prn.

## 2020-06-19 LAB — MICROALBUMIN, URINE: Microalbumin, Urine: 12.2 ug/mL

## 2020-06-30 ENCOUNTER — Encounter: Payer: Self-pay | Admitting: Family Medicine

## 2020-08-07 ENCOUNTER — Other Ambulatory Visit: Payer: Self-pay | Admitting: Family Medicine

## 2020-08-07 DIAGNOSIS — F32A Depression, unspecified: Secondary | ICD-10-CM

## 2020-08-24 ENCOUNTER — Other Ambulatory Visit: Payer: Self-pay | Admitting: Family Medicine

## 2020-09-17 ENCOUNTER — Other Ambulatory Visit: Payer: Self-pay | Admitting: Family Medicine

## 2020-09-17 ENCOUNTER — Telehealth: Payer: Self-pay

## 2020-09-17 NOTE — Telephone Encounter (Signed)
Pt made 3 month follow up needs blood worked ordered and notified when ordered.   Pt call back [phone]

## 2020-09-17 NOTE — Telephone Encounter (Signed)
Labs previously ordered

## 2020-09-18 ENCOUNTER — Ambulatory Visit: Payer: Medicare PPO | Admitting: Family Medicine

## 2020-10-03 DIAGNOSIS — Z79899 Other long term (current) drug therapy: Secondary | ICD-10-CM | POA: Diagnosis not present

## 2020-10-03 DIAGNOSIS — E039 Hypothyroidism, unspecified: Secondary | ICD-10-CM | POA: Diagnosis not present

## 2020-10-03 DIAGNOSIS — E785 Hyperlipidemia, unspecified: Secondary | ICD-10-CM | POA: Diagnosis not present

## 2020-10-03 DIAGNOSIS — I10 Essential (primary) hypertension: Secondary | ICD-10-CM | POA: Diagnosis not present

## 2020-10-03 DIAGNOSIS — E119 Type 2 diabetes mellitus without complications: Secondary | ICD-10-CM | POA: Diagnosis not present

## 2020-10-04 LAB — CBC WITH DIFFERENTIAL/PLATELET
Basophils Absolute: 0 10*3/uL (ref 0.0–0.2)
Basos: 0 %
EOS (ABSOLUTE): 0.4 10*3/uL (ref 0.0–0.4)
Eos: 4 %
Hematocrit: 42 % (ref 34.0–46.6)
Hemoglobin: 14.5 g/dL (ref 11.1–15.9)
Immature Grans (Abs): 0 10*3/uL (ref 0.0–0.1)
Immature Granulocytes: 0 %
Lymphocytes Absolute: 2.4 10*3/uL (ref 0.7–3.1)
Lymphs: 26 %
MCH: 30.5 pg (ref 26.6–33.0)
MCHC: 34.5 g/dL (ref 31.5–35.7)
MCV: 88 fL (ref 79–97)
Monocytes Absolute: 0.6 10*3/uL (ref 0.1–0.9)
Monocytes: 6 %
Neutrophils Absolute: 5.9 10*3/uL (ref 1.4–7.0)
Neutrophils: 64 %
Platelets: 242 10*3/uL (ref 150–450)
RBC: 4.76 x10E6/uL (ref 3.77–5.28)
RDW: 12.8 % (ref 11.7–15.4)
WBC: 9.3 10*3/uL (ref 3.4–10.8)

## 2020-10-04 LAB — COMPREHENSIVE METABOLIC PANEL
ALT: 19 IU/L (ref 0–32)
AST: 17 IU/L (ref 0–40)
Albumin/Globulin Ratio: 2 (ref 1.2–2.2)
Albumin: 4.3 g/dL (ref 3.7–4.7)
Alkaline Phosphatase: 73 IU/L (ref 44–121)
BUN/Creatinine Ratio: 15 (ref 12–28)
BUN: 19 mg/dL (ref 8–27)
Bilirubin Total: 0.5 mg/dL (ref 0.0–1.2)
CO2: 24 mmol/L (ref 20–29)
Calcium: 9.6 mg/dL (ref 8.7–10.3)
Chloride: 100 mmol/L (ref 96–106)
Creatinine, Ser: 1.31 mg/dL — ABNORMAL HIGH (ref 0.57–1.00)
GFR calc Af Amer: 47 mL/min/{1.73_m2} — ABNORMAL LOW (ref >59)
GFR calc non Af Amer: 41 mL/min/{1.73_m2} — ABNORMAL LOW (ref >59)
Globulin, Total: 2.1 g/dL (ref 1.5–4.5)
Glucose: 157 mg/dL — ABNORMAL HIGH (ref 65–99)
Potassium: 4.2 mmol/L (ref 3.5–5.2)
Sodium: 139 mmol/L (ref 134–144)
Total Protein: 6.4 g/dL (ref 6.0–8.5)

## 2020-10-04 LAB — LIPID PANEL
Chol/HDL Ratio: 3.7 ratio (ref 0.0–4.4)
Cholesterol, Total: 136 mg/dL (ref 100–199)
HDL: 37 mg/dL — ABNORMAL LOW (ref >39)
LDL Chol Calc (NIH): 71 mg/dL (ref 0–99)
Triglycerides: 164 mg/dL — ABNORMAL HIGH (ref 0–149)
VLDL Cholesterol Cal: 28 mg/dL (ref 5–40)

## 2020-10-04 LAB — HEMOGLOBIN A1C
Est. average glucose Bld gHb Est-mCnc: 143 mg/dL
Hgb A1c MFr Bld: 6.6 % — ABNORMAL HIGH (ref 4.8–5.6)

## 2020-10-04 LAB — TSH: TSH: 5.58 u[IU]/mL — ABNORMAL HIGH (ref 0.450–4.500)

## 2020-10-09 ENCOUNTER — Ambulatory Visit: Payer: Medicare PPO | Admitting: Family Medicine

## 2020-10-24 ENCOUNTER — Other Ambulatory Visit: Payer: Self-pay

## 2020-10-24 ENCOUNTER — Encounter: Payer: Self-pay | Admitting: Family Medicine

## 2020-10-24 ENCOUNTER — Ambulatory Visit: Payer: Medicare PPO | Admitting: Family Medicine

## 2020-10-24 VITALS — BP 132/80 | HR 65 | Temp 97.4°F | Wt 215.8 lb

## 2020-10-24 DIAGNOSIS — M79671 Pain in right foot: Secondary | ICD-10-CM | POA: Diagnosis not present

## 2020-10-24 DIAGNOSIS — I10 Essential (primary) hypertension: Secondary | ICD-10-CM

## 2020-10-24 DIAGNOSIS — H9193 Unspecified hearing loss, bilateral: Secondary | ICD-10-CM | POA: Diagnosis not present

## 2020-10-24 DIAGNOSIS — M79672 Pain in left foot: Secondary | ICD-10-CM | POA: Diagnosis not present

## 2020-10-24 DIAGNOSIS — F32A Depression, unspecified: Secondary | ICD-10-CM

## 2020-10-24 DIAGNOSIS — E039 Hypothyroidism, unspecified: Secondary | ICD-10-CM | POA: Diagnosis not present

## 2020-10-24 DIAGNOSIS — E785 Hyperlipidemia, unspecified: Secondary | ICD-10-CM | POA: Diagnosis not present

## 2020-10-24 DIAGNOSIS — E119 Type 2 diabetes mellitus without complications: Secondary | ICD-10-CM

## 2020-10-24 MED ORDER — ATORVASTATIN CALCIUM 40 MG PO TABS: ORAL_TABLET | ORAL | 1 refills | Status: DC

## 2020-10-24 MED ORDER — LEVOTHYROXINE SODIUM 137 MCG PO TABS
ORAL_TABLET | ORAL | 1 refills | Status: DC
Start: 2020-10-24 — End: 2021-01-27

## 2020-10-24 MED ORDER — ESCITALOPRAM OXALATE 10 MG PO TABS: 10.0000 mg | ORAL_TABLET | Freq: Every day | ORAL | 1 refills | Status: DC

## 2020-10-24 MED ORDER — ATENOLOL 50 MG PO TABS: 50.0000 mg | ORAL_TABLET | Freq: Every day | ORAL | 1 refills | Status: DC

## 2020-10-24 NOTE — Progress Notes (Signed)
Patient ID: Amy Hendrix, female    DOB: 07-03-1951, 70 y.o.   MRN: [id number]   Chief Complaint  Patient presents with  . Diabetes  . Depression  . Anxiety   Subjective:    HPI  Pt doing better with her anxiety. At the holidays she needed some of the clonazepam 85m- 1/2-1 tab prn. First Christmas w/o her son since they had moved out of state.  Has anxiety and tearfulness with this.  Pt taking 181mlexapro, has been on this since 2017.  Dm2- Compliant with medications. Checking blood glucose.   Not seeing any high or low numbers.  Denies polyuria or polydipsia.  Eye exam: overdue.  a1c has improved, from 7.3 to 6.6. Since the summer.  tsh elevated- 5.5 Was normal in 4/21. Pt taking 12527msynthroid.  Pt stating not missing any doses.  HTN Pt compliant with BP meds.  No SEs Denies chest pain, sob, LE swelling, or blurry vision.   HLD- doing well no new concerns.  Compliant with meds. No chest pain, palpitations, myalgias or joint pains.   Medical History Becky has a past medical history of Allergic rhinitis, Arthritis, Depression, Hypertension, Hypothyroid, PONV (postoperative nausea and vomiting), Sarcoidosis, Sinusitis, and [place] 2 diabetes mellitus [place]  Outpatient Encounter Medications as of 10/24/2020  Medication Sig  . acetaminophen (TYLENOL) 650 MG CR tablet Take 1,300 mg by mouth every 8 (eight) hours as needed for pain.  . Aromatic Inhalants (VICKS VAPOR INHALER IN) Place 1 Inhaler into the nose as needed (nasal congestion).  . aMarland Kitchenenolol (TENORMIN) 50 MG tablet Take 1 tablet (50 mg total) by mouth daily.  . aMarland Kitchenorvastatin (LIPITOR) 40 MG tablet TAKE (1) TABLET BY MOUTH ONCE DAILY.  . blood glucose meter kit and supplies Use to test blood sugar once a day. Please dispense based on pt insurance preference. DX: E11.9  . clonazePAM (KLONOPIN) 1 MG tablet Take 1/2-1 tablet po q day  prn stress  . colestipol (COLESTID) 1 g tablet TAKE 2 TABLETS DAILY.  .  diphenhydrAMINE (BENADRYL) 25 mg capsule Take 25 mg by mouth at bedtime as needed for allergies.  . eMarland Kitchencitalopram (LEXAPRO) 10 MG tablet Take 1 tablet (10 mg total) by mouth daily.  . gMarland KitchenipiZIDE (GLUCOTROL) 5 MG tablet Take one tablet po BID  . indapamide (LOZOL) 2.5 MG tablet Take 1 tablet (2.5 mg total) by mouth daily.  . Lancets (ONETOUCH DELICA PLUS [id number] USE TO TEST BLOOD SUGAR ONCE DAILY AS DIRECTED.  . lMarland Kitchenvothyroxine (SYNTHROID) 137 MCG tablet TAKE 1 TABLET BEFORE BREAKFAST.  . metFORMIN (GLUCOPHAGE-XR) 500 MG 24 hr tablet TAKE (2) TABLETS BY MOUTH EACH MORNING.  . nMarland Kitchenomycin-bacitracin-polymyxin (NEOSPORIN) ointment Apply 1 application topically as needed for wound care.   . OGlory RosebushTRA test strip USE TO TEST BLOOD SUGAR ONCE DAILY.  . oMarland KitchenyCODONE (OXY IR/ROXICODONE) 5 MG immediate release tablet Take 1-2 tablets (5-10 mg total) by mouth every 6 (six) hours as needed for moderate pain, severe pain or breakthrough pain.  . potassium chloride SA (KLOR-CON) 20 MEQ tablet Take 1 tablet (20 mEq total) by mouth daily.  . Pseudoephedrine HCl (SUDAFED 12 HOUR PO) Take 1 tablet by mouth daily as needed (allergies).  . triamcinolone (KENALOG) 0.1 % APPLY TO AFFECTED AREAS TWICE DAILY.  . [DISCONTINUED] atenolol (TENORMIN) 50 MG tablet TAKE ONE TABLET BY MOUTH ONCE DAILY.  . [DISCONTINUED] atorvastatin (LIPITOR) 40 MG tablet TAKE (1) TABLET BY MOUTH ONCE DAILY.  . [DISCONTINUED] escitalopram (LEXAPRO) 10  MG tablet TAKE 1 TABLET BY MOUTH ONCE A DAY.  . [DISCONTINUED] levothyroxine (SYNTHROID) 125 MCG tablet TAKE 1 TABLET BEFORE BREAKFAST.  . [DISCONTINUED] metFORMIN (GLUCOPHAGE) 500 MG tablet Take 1 tablet (500 mg total) by mouth 2 (two) times daily with a meal.   No facility-administered encounter medications on file as of 10/24/2020.     Review of Systems  Constitutional: Negative for chills and fever.  HENT: Negative for congestion, rhinorrhea and sore throat.   Respiratory: Negative  for cough, shortness of breath and wheezing.   Cardiovascular: Negative for chest pain and leg swelling.  Gastrointestinal: Negative for abdominal pain, diarrhea, nausea and vomiting.  Genitourinary: Negative for dysuria and frequency.  Musculoskeletal: Negative for arthralgias (bilateral foot pain) and back pain.  Skin: Negative for rash.  Neurological: Negative for dizziness, weakness and headaches.  Psychiatric/Behavioral: Negative for dysphoric mood, self-injury, sleep disturbance and suicidal ideas. The patient is nervous/anxious (improving).      Vitals BP 132/80   Pulse 65   Temp (!) 97.4 F (36.3 C)   Wt 215 lb 12.8 oz (97.9 kg)   SpO2 97%   BMI 38.23 kg/m   Objective:   Physical Exam Vitals and nursing note reviewed.  Constitutional:      Appearance: Normal appearance.  HENT:     Head: Normocephalic and atraumatic.     Nose: Nose normal.     Mouth/Throat:     Mouth: Mucous membranes are moist.     Pharynx: Oropharynx is clear.  Eyes:     Extraocular Movements: Extraocular movements intact.     Conjunctiva/sclera: Conjunctivae normal.     Pupils: Pupils are equal, round, and reactive to light.  Cardiovascular:     Rate and Rhythm: Normal rate and regular rhythm.     Pulses: Normal pulses.     Heart sounds: Normal heart sounds.  Pulmonary:     Effort: Pulmonary effort is normal.     Breath sounds: Normal breath sounds. No wheezing, rhonchi or rales.  Musculoskeletal:        General: Normal range of motion.     Right lower leg: No edema.     Left lower leg: No edema.  Skin:    General: Skin is warm and dry.     Findings: No lesion or rash.  Neurological:     General: No focal deficit present.     Mental Status: She is alert and oriented to person, place, and time.  Psychiatric:        Mood and Affect: Mood normal.        Behavior: Behavior normal.      Assessment and Plan   1. Depression, unspecified depression type - escitalopram (LEXAPRO) 10 MG  tablet; Take 1 tablet (10 mg total) by mouth daily.  Dispense: 90 tablet; Refill: 1  2. Decreased hearing of both ears - Ambulatory referral to Audiology  3. Bilateral foot pain  4. Diabetes mellitus without complication ([place])  5. Essential hypertension, benign - atenolol (TENORMIN) 50 MG tablet; Take 1 tablet (50 mg total) by mouth daily.  Dispense: 90 tablet; Refill: 1  6. Hypothyroidism, unspecified type - levothyroxine (SYNTHROID) 137 MCG tablet; TAKE 1 TABLET BEFORE BREAKFAST.  Dispense: 90 tablet; Refill: 1  7. Hyperlipidemia, unspecified hyperlipidemia type - atorvastatin (LIPITOR) 40 MG tablet; TAKE (1) TABLET BY MOUTH ONCE DAILY.  Dispense: 90 tablet; Refill: 1   Having arthritis pain in both feet.  Hard for her to walk long distances and needing  to sit. Tylenol prn for pain, rest, elevation and ice.  Call or rto if not improving.  Hypothyroidism- suboptimal. Will inc from 114mg to 1348m synthroid.  Will reck on next visit.  htn- stable. Cont meds.  HLD- stable. Cont meds.  Depression- stable. Improved. Cont meds. Anxiety- improving. Klonopin prn.  F/u 37m13mo

## 2020-10-28 ENCOUNTER — Telehealth: Payer: Self-pay | Admitting: Family Medicine

## 2020-12-20 ENCOUNTER — Telehealth: Payer: Self-pay | Admitting: Family Medicine

## 2020-12-23 NOTE — Telephone Encounter (Signed)
Please call patient and have her set up appt in next 30 days. Thank you

## 2020-12-23 NOTE — Telephone Encounter (Signed)
Sent my chart message to schedule appointment.

## 2020-12-23 NOTE — Telephone Encounter (Signed)
Needs 6 mo visit for her dm and thyroid.   Needs to be seen in next 30 days. Thx. Dr. Ladona Ridgel

## 2020-12-27 ENCOUNTER — Other Ambulatory Visit: Payer: Self-pay | Admitting: Family Medicine

## 2021-01-02 NOTE — Telephone Encounter (Signed)
Return call attempted as requested. No answer. Plan discussion with patient should they return call to Population Health general line of [phone], option 1.

## 2021-01-07 NOTE — Telephone Encounter (Signed)
No answer left voice mail

## 2021-01-16 NOTE — Telephone Encounter (Signed)
Scheduled appointment 8/10

## 2021-01-25 ENCOUNTER — Other Ambulatory Visit: Payer: Self-pay | Admitting: Family Medicine

## 2021-01-25 DIAGNOSIS — E039 Hypothyroidism, unspecified: Secondary | ICD-10-CM

## 2021-02-24 ENCOUNTER — Other Ambulatory Visit: Payer: Self-pay | Admitting: Family Medicine

## 2021-02-24 DIAGNOSIS — E119 Type 2 diabetes mellitus without complications: Secondary | ICD-10-CM

## 2021-02-24 DIAGNOSIS — I10 Essential (primary) hypertension: Secondary | ICD-10-CM

## 2021-02-24 DIAGNOSIS — E039 Hypothyroidism, unspecified: Secondary | ICD-10-CM

## 2021-02-24 DIAGNOSIS — E785 Hyperlipidemia, unspecified: Secondary | ICD-10-CM

## 2021-02-24 DIAGNOSIS — Z79899 Other long term (current) drug therapy: Secondary | ICD-10-CM

## 2021-02-25 NOTE — Addendum Note (Signed)
Addended by: Marlowe Shores on: 02/25/2021 01:49 PM   Modules accepted: Orders

## 2021-02-25 NOTE — Telephone Encounter (Signed)
Lab Results  Component Value Date   HGBA1C 6.6 (H) 10/03/2020    Lab Results  Component Value Date   CREATININE 1.31 (H) 10/03/2020     Lab Results  Component Value Date   CHOL 136 10/03/2020   HDL 37 (L) 10/03/2020   LDLCALC 71 10/03/2020   TRIG 164 (H) 10/03/2020   CHOLHDL 3.7 10/03/2020     BP Readings from Last 3 Encounters:  10/24/20 132/80  06/18/20 134/78  11/23/18 132/82

## 2021-04-07 ENCOUNTER — Other Ambulatory Visit: Payer: Self-pay | Admitting: Family Medicine

## 2021-04-23 ENCOUNTER — Ambulatory Visit: Payer: Medicare PPO | Admitting: Family Medicine

## 2021-04-28 ENCOUNTER — Other Ambulatory Visit: Payer: Self-pay | Admitting: Family Medicine

## 2021-05-05 DIAGNOSIS — E039 Hypothyroidism, unspecified: Secondary | ICD-10-CM | POA: Diagnosis not present

## 2021-05-05 DIAGNOSIS — I10 Essential (primary) hypertension: Secondary | ICD-10-CM | POA: Diagnosis not present

## 2021-05-05 DIAGNOSIS — E119 Type 2 diabetes mellitus without complications: Secondary | ICD-10-CM | POA: Diagnosis not present

## 2021-05-05 DIAGNOSIS — Z79899 Other long term (current) drug therapy: Secondary | ICD-10-CM | POA: Diagnosis not present

## 2021-05-05 DIAGNOSIS — E785 Hyperlipidemia, unspecified: Secondary | ICD-10-CM | POA: Diagnosis not present

## 2021-05-06 LAB — TSH: TSH: 1.04 u[IU]/mL (ref 0.450–4.500)

## 2021-05-06 LAB — HEMOGLOBIN A1C
Est. average glucose Bld gHb Est-mCnc: 166 mg/dL
Hgb A1c MFr Bld: 7.4 % — ABNORMAL HIGH (ref 4.8–5.6)

## 2021-05-06 LAB — LIPID PANEL
Chol/HDL Ratio: 3.4 ratio (ref 0.0–4.4)
Cholesterol, Total: 116 mg/dL (ref 100–199)
HDL: 34 mg/dL — ABNORMAL LOW
LDL Chol Calc (NIH): 52 mg/dL (ref 0–99)
Triglycerides: 182 mg/dL — ABNORMAL HIGH (ref 0–149)
VLDL Cholesterol Cal: 30 mg/dL (ref 5–40)

## 2021-05-06 LAB — COMPREHENSIVE METABOLIC PANEL
ALT: 18 IU/L (ref 0–32)
AST: 16 IU/L (ref 0–40)
Albumin/Globulin Ratio: 2.2 (ref 1.2–2.2)
Albumin: 4.1 g/dL (ref 3.7–4.7)
Alkaline Phosphatase: 74 IU/L (ref 44–121)
BUN/Creatinine Ratio: 17 (ref 12–28)
BUN: 22 mg/dL (ref 8–27)
Bilirubin Total: 0.5 mg/dL (ref 0.0–1.2)
CO2: 23 mmol/L (ref 20–29)
Calcium: 9.5 mg/dL (ref 8.7–10.3)
Chloride: 102 mmol/L (ref 96–106)
Creatinine, Ser: 1.32 mg/dL — ABNORMAL HIGH (ref 0.57–1.00)
Globulin, Total: 1.9 g/dL (ref 1.5–4.5)
Glucose: 185 mg/dL — ABNORMAL HIGH (ref 65–99)
Potassium: 3.8 mmol/L (ref 3.5–5.2)
Sodium: 141 mmol/L (ref 134–144)
Total Protein: 6 g/dL (ref 6.0–8.5)
eGFR: 43 mL/min/{1.73_m2} — ABNORMAL LOW (ref >59)

## 2021-05-06 LAB — CBC WITH DIFFERENTIAL/PLATELET
Basophils Absolute: 0 10*3/uL (ref 0.0–0.2)
Basos: 0 %
EOS (ABSOLUTE): 0.4 10*3/uL (ref 0.0–0.4)
Eos: 5 %
Hematocrit: 38.4 % (ref 34.0–46.6)
Hemoglobin: 13.1 g/dL (ref 11.1–15.9)
Immature Grans (Abs): 0 10*3/uL (ref 0.0–0.1)
Immature Granulocytes: 0 %
Lymphocytes Absolute: 2.7 10*3/uL (ref 0.7–3.1)
Lymphs: 28 %
MCH: 29.7 pg (ref 26.6–33.0)
MCHC: 34.1 g/dL (ref 31.5–35.7)
MCV: 87 fL (ref 79–97)
Monocytes Absolute: 0.8 10*3/uL (ref 0.1–0.9)
Monocytes: 8 %
Neutrophils Absolute: 5.6 10*3/uL (ref 1.4–7.0)
Neutrophils: 59 %
Platelets: 222 10*3/uL (ref 150–450)
RBC: 4.41 x10E6/uL (ref 3.77–5.28)
RDW: 12.7 % (ref 11.7–15.4)
WBC: 9.5 10*3/uL (ref 3.4–10.8)

## 2021-05-07 ENCOUNTER — Ambulatory Visit: Payer: Medicare PPO | Admitting: Family Medicine

## 2021-05-13 ENCOUNTER — Other Ambulatory Visit: Payer: Self-pay | Admitting: Family Medicine

## 2021-05-13 DIAGNOSIS — E785 Hyperlipidemia, unspecified: Secondary | ICD-10-CM

## 2021-05-14 ENCOUNTER — Encounter: Payer: Self-pay | Admitting: Family Medicine

## 2021-05-14 ENCOUNTER — Other Ambulatory Visit: Payer: Self-pay

## 2021-05-14 ENCOUNTER — Ambulatory Visit: Payer: Medicare PPO | Admitting: Family Medicine

## 2021-05-14 VITALS — BP 128/76 | HR 68 | Temp 97.0°F | Wt 215.4 lb

## 2021-05-14 DIAGNOSIS — E785 Hyperlipidemia, unspecified: Secondary | ICD-10-CM

## 2021-05-14 DIAGNOSIS — E039 Hypothyroidism, unspecified: Secondary | ICD-10-CM | POA: Diagnosis not present

## 2021-05-14 DIAGNOSIS — M65331 Trigger finger, right middle finger: Secondary | ICD-10-CM | POA: Diagnosis not present

## 2021-05-14 DIAGNOSIS — F419 Anxiety disorder, unspecified: Secondary | ICD-10-CM

## 2021-05-14 DIAGNOSIS — F32A Depression, unspecified: Secondary | ICD-10-CM

## 2021-05-14 DIAGNOSIS — I10 Essential (primary) hypertension: Secondary | ICD-10-CM | POA: Diagnosis not present

## 2021-05-14 MED ORDER — ESCITALOPRAM OXALATE 10 MG PO TABS: 10.0000 mg | ORAL_TABLET | Freq: Every day | ORAL | 1 refills | Status: DC

## 2021-05-14 MED ORDER — ATENOLOL 50 MG PO TABS: 50.0000 mg | ORAL_TABLET | Freq: Every day | ORAL | 1 refills | Status: DC

## 2021-05-14 MED ORDER — ATORVASTATIN CALCIUM 40 MG PO TABS: ORAL_TABLET | ORAL | 1 refills | Status: DC

## 2021-05-14 MED ORDER — INDAPAMIDE 2.5 MG PO TABS: 2.5000 mg | ORAL_TABLET | Freq: Every day | ORAL | 1 refills | Status: DC

## 2021-05-14 MED ORDER — METFORMIN HCL ER 500 MG PO TB24: ORAL_TABLET | ORAL | 1 refills | Status: DC

## 2021-05-14 MED ORDER — POTASSIUM CHLORIDE CRYS ER 20 MEQ PO TBCR: 60.0000 meq | EXTENDED_RELEASE_TABLET | Freq: Every day | ORAL | 1 refills | Status: DC

## 2021-05-14 MED ORDER — GLIPIZIDE 5 MG PO TABS
ORAL_TABLET | ORAL | 1 refills | Status: DC
Start: 2021-05-14 — End: 2022-01-26

## 2021-05-14 MED ORDER — COLESTIPOL HCL 1 G PO TABS: 2.0000 g | ORAL_TABLET | Freq: Every day | ORAL | 1 refills | Status: DC

## 2021-05-14 MED ORDER — LEVOTHYROXINE SODIUM 137 MCG PO TABS: ORAL_TABLET | ORAL | 1 refills | Status: DC

## 2021-05-14 MED ORDER — CLONAZEPAM 1 MG PO TABS: ORAL_TABLET | ORAL | 1 refills | Status: DC

## 2021-05-14 NOTE — Progress Notes (Signed)
Patient ID: Amy Hendrix, female    DOB: [birth year], 70 y.o.   MRN: [id number]   Chief Complaint  Patient presents with   Diabetes   Hypothyroidism   Subjective:    HPI Pt here for follow up on DM and thyroid.  Dm2- Compliant with medications. Checking blood glucose.   Not seeing any high or low numbers.  Denies polyuria or polydipsia.  Eye exam: over due. Foot exam: no new concerns. Lab Results  Component Value Date   HGBA1C 7.4 (H) 05/05/2021    Pt states things have been surprisingly good. Not checking sugars as often as she should but "based on living pretty good". No blurred vision, no dizziness but does have occasional unsteadiness but not interferes with everyday life.   Last few months was been on vacation and not eating best last few months. Larger meals.  Hypothyroidism- Not having any side effects from thyroid meds.  Compliant with meds. No diarrhea, constipation, depression/anxiety, palpitations, excessive hair loss or weight gain/loss. No heat/cold intolerance. TSH- improved.   Rt middle finger middle- having hard time straightening after gripping. Rt handed. Gripping not wanting to straighten.  No had mammo in a while and not wanting to have another . Not felt like going back, had h/o fibroadenomas.  HLD- doing well no new concerns.  Compliant with meds. No chest pain, palpitations, myalgias or joint pains.   Medical History Amy Hendrix has a past medical history of Allergic rhinitis, Arthritis, Depression, Hypertension, Hypothyroid, PONV (postoperative nausea and vomiting), Sarcoidosis, Sinusitis, and Type 2 diabetes mellitus ([place]).   Outpatient Encounter Medications as of 05/14/2021  Medication Sig   acetaminophen (TYLENOL) 650 MG CR tablet Take 1,300 mg by mouth every 8 (eight) hours as needed for pain.   Aromatic Inhalants (VICKS VAPOR INHALER IN) Place 1 Inhaler into the nose as needed (nasal congestion).   blood glucose meter kit and  supplies Use to test blood sugar once a day. Please dispense based on pt insurance preference. DX: E11.9   diphenhydrAMINE (BENADRYL) 25 mg capsule Take 25 mg by mouth at bedtime as needed for allergies.   Lancets (ONETOUCH DELICA PLUS [id number]) MISC USE TO TEST BLOOD SUGAR ONCE DAILY AS DIRECTED.   neomycin-bacitracin-polymyxin (NEOSPORIN) ointment Apply 1 application topically as needed for wound care.    ONETOUCH ULTRA test strip USE TO TEST BLOOD SUGAR ONCE DAILY.   oxyCODONE (OXY IR/ROXICODONE) 5 MG immediate release tablet Take 1-2 tablets (5-10 mg total) by mouth every 6 (six) hours as needed for moderate pain, severe pain or breakthrough pain.   Pseudoephedrine HCl (SUDAFED 12 HOUR PO) Take 1 tablet by mouth daily as needed (allergies).   triamcinolone (KENALOG) 0.1 % APPLY TO AFFECTED AREAS TWICE DAILY.   [DISCONTINUED] atenolol (TENORMIN) 50 MG tablet Take 1 tablet (50 mg total) by mouth daily.   [DISCONTINUED] atorvastatin (LIPITOR) 40 MG tablet TAKE (1) TABLET BY MOUTH ONCE DAILY.   [DISCONTINUED] clonazePAM (KLONOPIN) 1 MG tablet Take 1/2-1 tablet po q day  prn stress   [DISCONTINUED] colestipol (COLESTID) 1 g tablet TAKE 2 TABLETS DAILY.   [DISCONTINUED] escitalopram (LEXAPRO) 10 MG tablet Take 1 tablet (10 mg total) by mouth daily.   [DISCONTINUED] glipiZIDE (GLUCOTROL) 5 MG tablet TAKE 1 TABLET BY MOUTH TWICE DAILY.   [DISCONTINUED] indapamide (LOZOL) 2.5 MG tablet TAKE ONE TABLET BY MOUTH ONCE DAILY.   [DISCONTINUED] levothyroxine (SYNTHROID) 137 MCG tablet TAKE 1 TABLET BEFORE BREAKFAST.   [DISCONTINUED] metFORMIN (GLUCOPHAGE-XR) 500 MG 24  hr tablet TAKE (2) TABLETS BY MOUTH EACH MORNING.   [DISCONTINUED] potassium chloride SA (KLOR-CON) 20 MEQ tablet TAKE 3 TABLETS DAILY.   atenolol (TENORMIN) 50 MG tablet Take 1 tablet (50 mg total) by mouth daily.   atorvastatin (LIPITOR) 40 MG tablet TAKE (1) TABLET BY MOUTH ONCE DAILY.   clonazePAM (KLONOPIN) 1 MG tablet Take 1/2-1 tablet  po q day  prn stress   colestipol (COLESTID) 1 g tablet Take 2 tablets (2 g total) by mouth daily.   escitalopram (LEXAPRO) 10 MG tablet Take 1 tablet (10 mg total) by mouth daily.   glipiZIDE (GLUCOTROL) 5 MG tablet TAKE 1 TABLET BY MOUTH TWICE DAILY.   indapamide (LOZOL) 2.5 MG tablet Take 1 tablet (2.5 mg total) by mouth daily.   levothyroxine (SYNTHROID) 137 MCG tablet TAKE 1 TABLET BEFORE BREAKFAST.   metFORMIN (GLUCOPHAGE-XR) 500 MG 24 hr tablet TAKE (2) TABLETS BY MOUTH EACH MORNING.   potassium chloride SA (KLOR-CON) 20 MEQ tablet Take 3 tablets (60 mEq total) by mouth daily.   No facility-administered encounter medications on file as of 05/14/2021.     Review of Systems  Constitutional:  Negative for chills and fever.  HENT:  Negative for congestion, rhinorrhea and sore throat.   Respiratory:  Negative for cough, shortness of breath and wheezing.   Cardiovascular:  Negative for chest pain and leg swelling.  Gastrointestinal:  Negative for abdominal pain, diarrhea, nausea and vomiting.  Genitourinary:  Negative for dysuria and frequency.  Musculoskeletal:  Positive for arthralgias (rt middle finger). Negative for back pain.  Skin:  Negative for rash.  Neurological:  Negative for dizziness, weakness and headaches.    Vitals BP 128/76   Pulse 68   Temp (!) 97 F (36.1 C)   Wt 215 lb 6.4 oz (97.7 kg)   SpO2 96%   BMI 38.16 kg/m   Objective:   Physical Exam Vitals and nursing note reviewed.  Constitutional:      Appearance: Normal appearance.  HENT:     Head: Normocephalic and atraumatic.     Nose: Nose normal.     Mouth/Throat:     Mouth: Mucous membranes are moist.     Pharynx: Oropharynx is clear.  Eyes:     Extraocular Movements: Extraocular movements intact.     Conjunctiva/sclera: Conjunctivae normal.     Pupils: Pupils are equal, round, and reactive to light.  Cardiovascular:     Rate and Rhythm: Normal rate and regular rhythm.     Pulses: Normal pulses.      Heart sounds: Normal heart sounds.  Pulmonary:     Effort: Pulmonary effort is normal.     Breath sounds: Normal breath sounds. No wheezing, rhonchi or rales.  Musculoskeletal:        General: Normal range of motion.     Right lower leg: No edema.     Left lower leg: No edema.     Comments: +rt middle finger stuck in flexion, hard to straighten, no swelling, erythema, or warmth.  Skin:    General: Skin is warm and dry.     Findings: No lesion or rash.  Neurological:     General: No focal deficit present.     Mental Status: She is alert and oriented to person, place, and time.     Cranial Nerves: No cranial nerve deficit.  Psychiatric:        Mood and Affect: Mood normal.        Behavior: Behavior normal.  Assessment and Plan   1. Hypothyroidism, unspecified type - levothyroxine (SYNTHROID) 137 MCG tablet; TAKE 1 TABLET BEFORE BREAKFAST.  Dispense: 90 tablet; Refill: 1  2. Trigger middle finger of right hand - Ambulatory referral to Hand Surgery  3. Anxiety - clonazePAM (KLONOPIN) 1 MG tablet; Take 1/2-1 tablet po q day  prn stress  Dispense: 20 tablet; Refill: 1  4. Hyperlipidemia, unspecified hyperlipidemia type - atorvastatin (LIPITOR) 40 MG tablet; TAKE (1) TABLET BY MOUTH ONCE DAILY.  Dispense: 90 tablet; Refill: 1  5. Depression, unspecified depression type - escitalopram (LEXAPRO) 10 MG tablet; Take 1 tablet (10 mg total) by mouth daily.  Dispense: 90 tablet; Refill: 1  6. Essential hypertension, benign - atenolol (TENORMIN) 50 MG tablet; Take 1 tablet (50 mg total) by mouth daily.  Dispense: 90 tablet; Refill: 1    Pt needing eye exam for diabetes. Pt not seen one.  Depression- stable.  Cont meds. Anxiety- stable. Cont klonopin prn. Htn- stable. Cont meds. Hypothyroidism- stable. Cont meds  Ckd- elevated cr at 1.32.  stable. Cont to monitor.  Dm2- stable. However, a1c elevated compared to last time at 6.6., now a1c at 7.4.   Rt trigger finger-  ortho referral.  Return in about 6 months (around 11/14/2021) for dm2, htn, hld.

## 2021-09-30 ENCOUNTER — Ambulatory Visit: Payer: Medicare PPO | Admitting: Family Medicine

## 2021-09-30 ENCOUNTER — Other Ambulatory Visit: Payer: Self-pay

## 2021-09-30 ENCOUNTER — Encounter: Payer: Self-pay | Admitting: Family Medicine

## 2021-09-30 DIAGNOSIS — E782 Mixed hyperlipidemia: Secondary | ICD-10-CM

## 2021-09-30 DIAGNOSIS — E119 Type 2 diabetes mellitus without complications: Secondary | ICD-10-CM

## 2021-09-30 DIAGNOSIS — M79671 Pain in right foot: Secondary | ICD-10-CM

## 2021-09-30 DIAGNOSIS — M79673 Pain in unspecified foot: Secondary | ICD-10-CM | POA: Insufficient documentation

## 2021-09-30 DIAGNOSIS — E785 Hyperlipidemia, unspecified: Secondary | ICD-10-CM | POA: Insufficient documentation

## 2021-09-30 DIAGNOSIS — I10 Essential (primary) hypertension: Secondary | ICD-10-CM | POA: Diagnosis not present

## 2021-09-30 DIAGNOSIS — N183 Chronic kidney disease, stage 3 unspecified: Secondary | ICD-10-CM | POA: Insufficient documentation

## 2021-09-30 DIAGNOSIS — F32A Depression, unspecified: Secondary | ICD-10-CM | POA: Insufficient documentation

## 2021-09-30 DIAGNOSIS — N1831 Chronic kidney disease, stage 3a: Secondary | ICD-10-CM | POA: Insufficient documentation

## 2021-09-30 MED ORDER — TRIAMCINOLONE ACETONIDE 0.1 % EX CREA: TOPICAL_CREAM | CUTANEOUS | 0 refills | Status: DC

## 2021-09-30 NOTE — Progress Notes (Signed)
Subjective:  Patient ID: Amy Hendrix, female    DOB: Sep 12, 1951  Age: 71 y.o. MRN: [id number]  CC: Chief Complaint  Patient presents with   Hypertension    Shingles vaccine   Diabetes    Hasn't checked sugars in months. Would like to talk with PCP regarding pain med.     HPI:  71 year old female with anxiety/depression, CKD, diabetes, hypertension, history of sarcoidosis, hypothyroidism presents for follow-up.  Type 2 Diabetes Last A1c was 7.4 in August. Patient states that she is having a significant amount of diarrhea which is interfering with her normal activities.  She attributes this to metformin.  She states that this has been going on for least a year.  She is taking 1000 mg of metformin XR daily.  She is also on glipizide 5 mg twice daily. Patient does not check her blood sugars.  No reports of hypoglycemia.  Hypertension Blood pressure is well controlled on atenolol and indapamide.  Foot pain Patient states that she has known osteoarthritis.  She has ongoing foot pain particularly of the distal aspect of the right foot.  She would like to discuss what she can do to aid in her pain.  She is using Tylenol without significant improvement.  No recent fall, trauma, injury.  Hyperlipidemia LDL is well controlled.  Triglycerides mildly elevated.  Patient currently on Lipitor and tolerating well.  Patient Active Problem List   Diagnosis Date Noted   Anxiety and depression 09/30/2021   CKD (chronic kidney disease) stage 3, GFR 30-59 ml/min (HCC) 09/30/2021   Hyperlipidemia 09/30/2021   Foot pain 09/30/2021   Diabetes mellitus without complication ([place]) [id number]   Essential hypertension, benign 02/21/2013   Sarcoidosis 02/21/2013   Hypothyroidism 02/21/2013    Social Hx   Social History   Socioeconomic History   Marital status: Married    Spouse name: Not on file   Number of children: Not on file   Years of education: Not on file   Highest education level: Not  on file  Occupational History   Not on file  Tobacco Use   Smoking status: Never   Smokeless tobacco: Never  Substance and Sexual Activity   Alcohol use: No   Drug use: No   Sexual activity: Not on file  Other Topics Concern   Not on file  Social History Narrative   Not on file   Social Determinants of Health   Financial Resource Strain: Not on file  Food Insecurity: Not on file  Transportation Needs: Not on file  Physical Activity: Not on file  Stress: Not on file  Social Connections: Not on file    Review of Systems Per HPI  Objective:  BP 134/74    Pulse (!) 59    Temp 97.9 F (36.6 C)    Wt 213 lb 12.8 oz (97 kg)    SpO2 95%    BMI 37.87 kg/m   BP/Weight 09/30/2021 05/14/2021 [id number]  Systolic BP [id number] 0000000 [id number]  Diastolic BP 74 76 80  Wt. (Lbs) 213.8 215.4 215.8  BMI 37.87 38.16 38.23    Physical Exam Vitals and nursing note reviewed.  Constitutional:      General: She is not in acute distress.    Appearance: Normal appearance. She is obese. She is not ill-appearing.  HENT:     Head: Normocephalic and atraumatic.  Eyes:     General:        Right eye: No discharge.  Left eye: No discharge.     Conjunctiva/sclera: Conjunctivae normal.  Cardiovascular:     Rate and Rhythm: Normal rate and regular rhythm.  Pulmonary:     Effort: Pulmonary effort is normal.     Breath sounds: Normal breath sounds. No wheezing, rhonchi or rales.  Neurological:     Mental Status: She is alert.  Psychiatric:        Mood and Affect: Mood normal.        Behavior: Behavior normal.    Lab Results  Component Value Date   WBC 9.5 05/05/2021   HGB 13.1 05/05/2021   HCT 38.4 05/05/2021   PLT 222 05/05/2021   GLUCOSE 185 (H) 05/05/2021   CHOL 116 05/05/2021   TRIG 182 (H) 05/05/2021   HDL 34 (L) 05/05/2021   LDLCALC 52 05/05/2021   ALT 18 05/05/2021   AST 16 05/05/2021   NA 141 05/05/2021   K 3.8 05/05/2021   CL 102 05/05/2021   CREATININE 1.32 (H) 05/05/2021    BUN 22 05/05/2021   CO2 23 05/05/2021   TSH 1.040 05/05/2021   HGBA1C 7.4 (H) 05/05/2021   MICROALBUR 0.8 08/28/2014     Assessment & Plan:   Problem List Items Addressed This Visit       Cardiovascular and Mediastinum   Essential hypertension, benign    Blood pressure is well controlled.  Continue atenolol and indapamide.        Endocrine   Diabetes mellitus without complication (HCC)    Unsure of control.  However, patient is not tolerating metformin.  Discontinue metformin.  A1c in 1 month.  Continue glipizide.        Other   Foot pain    Trial of Voltaren gel.      Hyperlipidemia    Stable.  Continue Lipitor.        Meds ordered this encounter  Medications   triamcinolone cream (KENALOG) 0.1 %    Sig: APPLY TO AFFECTED AREAS TWICE DAILY.    Dispense:  60 g    Refill:  0    Follow-up:  Return in about 3 months (around 12/29/2021) for Diabetes follow up, HTN follow up.  [place]

## 2021-09-30 NOTE — Assessment & Plan Note (Signed)
Stable. Continue Lipitor. 

## 2021-09-30 NOTE — Patient Instructions (Signed)
Stop the Metformin.  Try OTC Voltaren.  Labs in 1 month.  Follow up in 3 months.

## 2021-09-30 NOTE — Assessment & Plan Note (Signed)
Trial of Voltaren gel.

## 2021-09-30 NOTE — Assessment & Plan Note (Signed)
Unsure of control.  However, patient is not tolerating metformin.  Discontinue metformin.  A1c in 1 month.  Continue glipizide.

## 2021-09-30 NOTE — Assessment & Plan Note (Signed)
Blood pressure is well controlled.  Continue atenolol and indapamide.

## 2021-11-04 ENCOUNTER — Telehealth: Payer: Self-pay

## 2021-11-04 ENCOUNTER — Ambulatory Visit (INDEPENDENT_AMBULATORY_CARE_PROVIDER_SITE_OTHER): Payer: Medicare PPO

## 2021-11-04 ENCOUNTER — Other Ambulatory Visit: Payer: Self-pay

## 2021-11-04 VITALS — Ht 63.0 in | Wt 213.0 lb

## 2021-11-04 DIAGNOSIS — Z780 Asymptomatic menopausal state: Secondary | ICD-10-CM

## 2021-11-04 DIAGNOSIS — E039 Hypothyroidism, unspecified: Secondary | ICD-10-CM

## 2021-11-04 DIAGNOSIS — Z79899 Other long term (current) drug therapy: Secondary | ICD-10-CM

## 2021-11-04 DIAGNOSIS — I10 Essential (primary) hypertension: Secondary | ICD-10-CM

## 2021-11-04 DIAGNOSIS — Z0000 Encounter for general adult medical examination without abnormal findings: Secondary | ICD-10-CM

## 2021-11-04 DIAGNOSIS — E785 Hyperlipidemia, unspecified: Secondary | ICD-10-CM

## 2021-11-04 DIAGNOSIS — E119 Type 2 diabetes mellitus without complications: Secondary | ICD-10-CM

## 2021-11-04 NOTE — Telephone Encounter (Signed)
Last labs 05/05/21: TSH, CMP, Lipid, CBC, HgbA1c

## 2021-11-04 NOTE — Addendum Note (Signed)
Addended by: Dairl Ponder on: 11/04/2021 02:57 PM   Modules accepted: Orders

## 2021-11-04 NOTE — Telephone Encounter (Signed)
Please order CBC, CMP w/GFR, A1C, Lipid, TSH.   Thank you   Dr. Lacinda Axon

## 2021-11-04 NOTE — Telephone Encounter (Signed)
Blood work ordered in Epic. Patient notified. 

## 2021-11-04 NOTE — Telephone Encounter (Signed)
Pt states that she needs her follow up labs scheduled and a follow up appointment with Dr. Lacinda Axon afterwards. Thank you.

## 2021-11-04 NOTE — Progress Notes (Signed)
Subjective:   Amy Hendrix is a 71 y.o. female who presents for an Initial Medicare Annual Wellness Visit. Virtual Visit via Telephone Note  I connected with  [name] on 11/04/21 at  9:00 AM EST by telephone and verified that I am speaking with the correct person using two identifiers.  Location: Patient: HOME Provider: [facility] Persons participating in the virtual visit: patient/Nurse Health Advisor   I discussed the limitations, risks, security and privacy concerns of performing an evaluation and management service by telephone and the availability of in person appointments. The patient expressed understanding and agreed to proceed.  Interactive audio and video telecommunications were attempted between this nurse and patient, however failed, due to patient having technical difficulties OR patient did not have access to video capability.  We continued and completed visit with audio only.  Some vital signs may be absent or patient reported.   Chriss Driver, LPN  Review of Systems     Cardiac Risk Factors include: advanced age (>32mn, >>12women);diabetes mellitus;hypertension;dyslipidemia;sedentary lifestyle;obesity (BMI >30kg/m2)  PHONE VISIT. PT AT HOME. NURSE AT [facility].    Objective:    Today's Vitals   11/04/21 0907  Weight: 213 lb (96.6 kg)  Height: 5' 3"  (1.6 m)   Body mass index is 37.73 kg/m.  Advanced Directives 11/04/2021 12/09/2017 09/07/2017  Does Patient Have a Medical Advance Directive? Yes Yes Yes  Type of [place]amed[place]of [place] Ac[place] will Healthcare Power of [place] will  Does patient want to make changes to medical advance directive? - - No - Patient declined  Copy of HVandaliain Chart? No - copy requested No - copy requested No - copy requested    Current Medications (verified) Outpatient Encounter Medications as of 11/04/2021  Medication Sig   acetaminophen  (TYLENOL) 650 MG CR tablet Take 1,300 mg by mouth every 8 (eight) hours as needed for pain.   Aromatic Inhalants (VICKS VAPOR INHALER IN) Place 1 Inhaler into the nose as needed (nasal congestion).   atenolol (TENORMIN) 50 MG tablet Take 1 tablet (50 mg total) by mouth daily.   atorvastatin (LIPITOR) 40 MG tablet TAKE (1) TABLET BY MOUTH ONCE DAILY.   blood glucose meter kit and supplies Use to test blood sugar once a day. Please dispense based on pt insurance preference. DX: E11.9   clonazePAM (KLONOPIN) 1 MG tablet Take 1/2-1 tablet po q day  prn stress   colestipol (COLESTID) 1 g tablet Take 2 tablets (2 g total) by mouth daily.   diphenhydrAMINE (BENADRYL) 25 mg capsule Take 25 mg by mouth at bedtime as needed for allergies.   escitalopram (LEXAPRO) 10 MG tablet Take 1 tablet (10 mg total) by mouth daily.   glipiZIDE (GLUCOTROL) 5 MG tablet TAKE 1 TABLET BY MOUTH TWICE DAILY.   indapamide (LOZOL) 2.5 MG tablet Take 1 tablet (2.5 mg total) by mouth daily.   Lancets (ONETOUCH DELICA PLUS [id number] MISC USE TO TEST BLOOD SUGAR ONCE DAILY AS DIRECTED.   levothyroxine (SYNTHROID) 137 MCG tablet TAKE 1 TABLET BEFORE BREAKFAST.   neomycin-bacitracin-polymyxin (NEOSPORIN) ointment Apply 1 application topically as needed for wound care.    ONETOUCH ULTRA test strip USE TO TEST BLOOD SUGAR ONCE DAILY.   potassium chloride SA (KLOR-CON) 20 MEQ tablet Take 3 tablets (60 mEq total) by mouth daily.   Pseudoephedrine HCl (SUDAFED 12 HOUR PO) Take 1 tablet by mouth daily as needed (allergies).   triamcinolone  cream (KENALOG) 0.1 % APPLY TO AFFECTED AREAS TWICE DAILY.   No facility-administered encounter medications on file as of 11/04/2021.    Allergies (verified) Patient has no known allergies.   History: Past Medical History:  Diagnosis Date   Allergic rhinitis    Arthritis    Depression    Hypertension    Hypothyroid    PONV (postoperative nausea and vomiting)    hard time waking up w/ nasal  surgery   Sarcoidosis    Sinusitis    Type 2 diabetes mellitus ([place])    Past Surgical History:  Procedure Laterality Date   APPENDECTOMY     CESAREAN SECTION     CHOLECYSTECTOMY     COLONOSCOPY N/A 12/09/2017   Procedure: COLONOSCOPY;  Surgeon: Rogene Houston, MD;  [facility];  Service: Endoscopy;  Laterality: N/A;  730   EYE SURGERY Bilateral 2015   cataract surgery   FOOT SURGERY Bilateral mid 1990's   HAND SURGERY Left 2012   debridement/incision   INSERTION OF MESH N/A 09/10/2017   Procedure: INSERTION OF MESH;  Surgeon: Judeth Horn, MD;  Location: [place];  Service: General;  Laterality: N/A;   NASAL SINUS SURGERY  1761   UMBILICAL HERNIA REPAIR N/A 09/10/2017   Procedure: LAPAROSCOPIC UMBILICAL HERNIA;  Surgeon: Judeth Horn, MD;  Location: [place];  Service: General;  Laterality: N/A;   No family history on file. Social History   Socioeconomic History   Marital status: Married    Spouse name: Iona Beard   Number of children: 2   Years of education: Not on file   Highest education level: Not on file  Occupational History   Not on file  Tobacco Use   Smoking status: Never   Smokeless tobacco: Never  Substance and Sexual Activity   Alcohol use: No   Drug use: No   Sexual activity: Not on file  Other Topics Concern   Not on file  Social History Narrative   Married x 38 years in June 2023.   Social Determinants of Health   Financial Resource Strain: Low Risk    Difficulty of Paying Living Expenses: Not hard at all  Food Insecurity: No Food Insecurity   Worried About Charity fundraiser in the Last Year: Never true   [place] in the Last Year: Never true  Transportation Needs: No Transportation Needs   Lack of Transportation (Medical): No   Lack of Transportation (Non-Medical): No  Physical Activity: Inactive   Days of Exercise per Week: 0 days   Minutes of Exercise per Session: 0 min  Stress: No Stress Concern Present   Feeling of Stress :  Not at all  Social Connections: Moderately Integrated   Frequency of Communication with Friends and Family: More than three times a week   Frequency of Social Gatherings with Friends and Family: More than three times a week   Attends Religious Services: 1 to 4 times per year   Active Member of [employer] or Organizations: No   Attends Music therapist: Never   Marital Status: Married    Tobacco Counseling Counseling given: Not Answered   Clinical Intake:  Pre-visit preparation completed: Yes  Pain : No/denies pain     BMI - recorded: 37.73 Nutritional Status: BMI > 30  Obese Nutritional Risks: None Diabetes: Yes  How often do you need to have someone help you when you read instructions, pamphlets, or other written materials from your doctor or pharmacy?:  1 - Never  Diabetic?Nutrition Risk Assessment:  Has the patient had any N/V/D within the last 2 months?  Yes  But is now better since stopping Metformin. Does the patient have any non-healing wounds?  No  Has the patient had any unintentional weight loss or weight gain?  No   Diabetes:  Is the patient diabetic?  Yes  If diabetic, was a CBG obtained today?  No  Did the patient bring in their glucometer from home?  No  Phone visit. How often do you monitor your CBG's? Irregularly.   Financial Strains and Diabetes Management:  Are you having any financial strains with the device, your supplies or your medication? Yes .  Does the patient want to be seen by Chronic Care Management for management of their diabetes?  No  Would the patient like to be referred to a Nutritionist or for Diabetic Management?  No   Diabetic Exams:  Diabetic Eye Exam: Completed DUE. Overdue for diabetic eye exam. Pt has been advised about the importance in completing this exam.  Diabetic Foot Exam: Completed DUE. Pt has been advised about the importance in completing this exam.   Interpreter Needed?: No  Information entered by :: mj  Kacee Koren, lpn   Activities of Daily Living In your present state of health, do you have any difficulty performing the following activities: 11/04/2021  Hearing? N  Vision? N  Difficulty concentrating or making decisions? N  Walking or climbing stairs? N  Dressing or bathing? N  Doing errands, shopping? N  Preparing Food and eating ? N  Using the Toilet? N  In the past six months, have you accidently leaked urine? N  Do you have problems with loss of bowel control? Y  Comment with Metformin  Managing your Medications? N  Managing your Finances? N  Housekeeping or managing your Housekeeping? N  Some recent data might be hidden    Patient Care Team: Coral Spikes, DO as PCP - General (Family Medicine)  Indicate any recent Medical Services you may have received from other than [facility] providers in the past year (date may be approximate).     Assessment:   This is a routine wellness examination for Ronniesha.  Hearing/Vision screen Hearing Screening - Comments:: Hearing aids, 09/05/2021. Vision Screening - Comments:: Readers. Cataract surgery. Dr. Radford Pax in [place].   Dietary issues and exercise activities discussed: Current Exercise Habits: The patient does not participate in regular exercise at present, Exercise limited by: cardiac condition(s)   Goals Addressed             This Visit's Progress    Exercise 3x per week (30 min per time)       Move more and eat healthy. Work in garden.       Depression Screen PHQ 2/9 Scores 11/04/2021 10/24/2020 06/18/2020 05/23/2019 11/23/2018 08/05/2017 03/23/2017  PHQ - 2 Score 0 0 6 4 0 0 2  PHQ- 9 Score - 0 16 6 5  - 6    Fall Risk Fall Risk  11/04/2021 05/14/2021 01/10/2020 03/25/2016 03/25/2016  Falls in the past year? 1 1 0 No No  Number falls in past yr: 0 0 0 - -  Injury with Fall? 0 0 0 - -  Risk for fall due to : History of fall(s);Impaired balance/gait Other (Comment) - - -  Risk for fall due to: Comment - twisted in garden hose - - -   Follow up Falls prevention discussed Falls evaluation completed;Education provided Falls evaluation  completed - -    FALL RISK PREVENTION PERTAINING TO THE HOME:  Any stairs in or around the home? No  If so, are there any without handrails? No  Home free of loose throw rugs in walkways, pet beds, electrical cords, etc? Yes  Adequate lighting in your home to reduce risk of falls? Yes   ASSISTIVE DEVICES UTILIZED TO PREVENT FALLS:  Life alert? No  Use of a cane, walker or w/c? No  Grab bars in the bathroom? No  Shower chair or bench in shower? No  Elevated toilet seat or a handicapped toilet? No   TIMED UP AND GO:  Was the test performed? No .  Phone visit.  Cognitive Function:     6CIT Screen 11/04/2021  What Year? 0 points  What month? 0 points  What time? 0 points  Count back from 20 0 points  Months in reverse 0 points  Repeat phrase 0 points  Total Score 0    Immunizations Immunization History  Administered Date(s) Administered   Fluad Quad(high Dose 65+) 06/18/2020   Influenza,inj,Quad PF,6+ Mos 08/28/2014, 09/25/2015, 06/08/2016, 07/20/2017, 07/13/2018, 07/13/2019   Influenza-Unspecified 07/29/2012, 07/19/2013   PFIZER(Purple Top)SARS-COV-2 Vaccination 12/08/2019, 01/05/2020, 08/09/2020   Pneumococcal Conjugate-13 09/25/2015   Pneumococcal Polysaccharide-23 11/06/2008, 11/23/2018   Tdap 05/21/2016    TDAP status: Up to date  Flu Vaccine status: Up to date  Pneumococcal vaccine status: Up to date  Covid-19 vaccine status: Completed vaccines  Qualifies for Shingles Vaccine? Yes   Zostavax completed No   Shingrix Completed?: No.    Education has been provided regarding the importance of this vaccine. Patient has been advised to call insurance company to determine out of pocket expense if they have not yet received this vaccine. Advised may also receive vaccine at local pharmacy or Health Dept. Verbalized acceptance and understanding.  Screening  Tests Health Maintenance  Topic Date Due   OPHTHALMOLOGY EXAM  Never done   Hepatitis C Screening  Never done   Zoster Vaccines- Shingrix (1 of 2) Never done   FOOT EXAM  07/14/2019   COVID-19 Vaccine (4 - Booster for Pfizer series) 10/04/2020   INFLUENZA VACCINE  04/28/2021   URINE MICROALBUMIN  06/18/2021   MAMMOGRAM  05/14/2022 (Originally 08/24/2014)   HEMOGLOBIN A1C  11/05/2021   TETANUS/TDAP  05/21/2026   COLONOSCOPY (Pts 45-62yr Insurance coverage will need to be confirmed)  12/10/2027   Pneumonia Vaccine 71 Years old  Completed   DEXA SCAN  Completed   HPV VACCINES  Aged Out    Health Maintenance  Health Maintenance Due  Topic Date Due   OPHTHALMOLOGY EXAM  Never done   Hepatitis C Screening  Never done   Zoster Vaccines- Shingrix (1 of 2) Never done   FOOT EXAM  07/14/2019   COVID-19 Vaccine (4 - Booster for Pfizer series) 10/04/2020   INFLUENZA VACCINE  04/28/2021   URINE MICROALBUMIN  06/18/2021    Colorectal cancer screening: Type of screening: Colonoscopy. Completed 12/09/2017. Repeat every 10 years  Mammogram status: Completed 08/24/2012. Repeat every year  Bone Density status: Ordered 11/04/2021. Pt provided with contact info and advised to call to schedule appt.  Lung Cancer Screening: (Low Dose CT Chest recommended if Age 73-80years, 30 pack-year currently smoking OR have quit w/in 15years.) does not qualify.   Additional Screening:  Hepatitis C Screening: does qualify; Completed DUE  Vision Screening: Recommended annual ophthalmology exams for early detection of glaucoma and other disorders of the eye. Is the  patient up to date with their annual eye exam?  Yes  Who is the provider or what is the name of the office in which the patient attends annual eye exams? Dr. Radford Pax in [place] If pt is not established with a provider, would they like to be referred to a provider to establish care? No .   Dental Screening: Recommended annual dental exams for proper  oral hygiene  Community Resource Referral / Chronic Care Management: CRR required this visit?  No   CCM required this visit?  No      Plan:     I have personally reviewed and noted the following in the patients chart:   Medical and social history Use of alcohol, tobacco or illicit drugs  Current medications and supplements including opioid prescriptions. Patient is not currently taking opioid prescriptions. Functional ability and status Nutritional status Physical activity Advanced directives List of other physicians Hospitalizations, surgeries, and ER visits in previous 12 months Vitals Screenings to include cognitive, depression, and falls Referrals and appointments  In addition, I have reviewed and discussed with patient certain preventive protocols, quality metrics, and best practice recommendations. A written personalized care plan for preventive services as well as general preventive health recommendations were provided to patient.     Chriss Driver, LPN   03/31/813   Nurse Notes: Discussed mammogram and dexa. Pt would like to repeat Dexa but would like to postpone mammogram. Order placed for Dexa. Pt states she has received flu vaccine and last covid booster. Asked pt to bring in vaccine record, so dates can be documented in chart. Pt verbalized understanding.

## 2021-11-04 NOTE — Patient Instructions (Signed)
[name] , Thank you for taking time to come for your Medicare Wellness Visit. I appreciate your ongoing commitment to your health goals. Please review the following plan we discussed and let me know if I can assist you in the future.   Screening recommendations/referrals: Colonoscopy: Done 12/09/2017 Repeat in 10 years  Mammogram: Done 08/24/2012 Repeat annually  Bone Density: Done 10/10/2012, order placed to schedule.   Recommended yearly ophthalmology/optometry visit for glaucoma screening and checkup Recommended yearly dental visit for hygiene and checkup  Vaccinations: Influenza vaccine: Done  Pneumococcal vaccine: Done 11/06/2008, 09/25/2015 and 11/23/2018 Tdap vaccine: Done 05/01/2016 Repeat in 10 years  Shingles vaccine: Discussed.   Covid-19:Done 12/08/2019, 01/05/2020 and 08/09/2020.  Advanced directives: Please bring a copy of your health care power of attorney and living will to the office to be added to your chart at your convenience.   Conditions/risks identified: Aim for 30 minutes of exercise or brisk walking each day, drink 6-8 glasses of water and eat lots of fruits and vegetables.   Next appointment: Follow up in one year for your annual wellness visit 2024.   Preventive Care 44 Years and Older, Female Preventive care refers to lifestyle choices and visits with your health care provider that can promote health and wellness. What does preventive care include? A yearly physical exam. This is also called an annual well check. Dental exams once or twice a year. Routine eye exams. Ask your health care provider how often you should have your eyes checked. Personal lifestyle choices, including: Daily care of your teeth and gums. Regular physical activity. Eating a healthy diet. Avoiding tobacco and drug use. Limiting alcohol use. Practicing safe sex. Taking low-dose aspirin every day. Taking vitamin and mineral supplements as recommended by your health care  provider. What happens during an annual well check? The services and screenings done by your health care provider during your annual well check will depend on your age, overall health, lifestyle risk factors, and family history of disease. Counseling  Your health care provider may ask you questions about your: Alcohol use. Tobacco use. Drug use. Emotional well-being. Home and relationship well-being. Sexual activity. Eating habits. History of falls. Memory and ability to understand (cognition). Work and work Statistician. Reproductive health. Screening  You may have the following tests or measurements: Height, weight, and BMI. Blood pressure. Lipid and cholesterol levels. These may be checked every 5 years, or more frequently if you are over 61 years old. Skin check. Lung cancer screening. You may have this screening every year starting at age 43 if you have a 30-pack-year history of smoking and currently smoke or have quit within the past 15 years. Fecal occult blood test (FOBT) of the stool. You may have this test every year starting at age 38. Flexible sigmoidoscopy or colonoscopy. You may have a sigmoidoscopy every 5 years or a colonoscopy every 10 years starting at age 39. Hepatitis C blood test. Hepatitis B blood test. Sexually transmitted disease (STD) testing. Diabetes screening. This is done by checking your blood sugar (glucose) after you have not eaten for a while (fasting). You may have this done every 1-3 years. Bone density scan. This is done to screen for osteoporosis. You may have this done starting at age 25. Mammogram. This may be done every 1-2 years. Talk to your health care provider about how often you should have regular mammograms. Talk with your health care provider about your test results, treatment options, and if necessary, the need for more  tests. Vaccines  Your health care provider may recommend certain vaccines, such as: Influenza vaccine. This is  recommended every year. Tetanus, diphtheria, and acellular pertussis (Tdap, Td) vaccine. You may need a Td booster every 10 years. Zoster vaccine. You may need this after age 43. Pneumococcal 13-valent conjugate (PCV13) vaccine. One dose is recommended after age 2. Pneumococcal polysaccharide (PPSV23) vaccine. One dose is recommended after age 71. Talk to your health care provider about which screenings and vaccines you need and how often you need them. This information is not intended to replace advice given to you by your health care provider. Make sure you discuss any questions you have with your health care provider. Document Released: 10/11/2015 Document Revised: 06/03/2016 Document Reviewed: 07/16/2015 Elsevier Interactive Patient Education  2017 [employer] Prevention in the Home Falls can cause injuries. They can happen to people of all ages. There are many things you can do to make your home safe and to help prevent falls. What can I do on the outside of my home? Regularly fix the edges of walkways and driveways and fix any cracks. Remove anything that might make you trip as you walk through a door, such as a raised step or threshold. Trim any bushes or trees on the path to your home. Use bright outdoor lighting. Clear any walking paths of anything that might make someone trip, such as rocks or tools. Regularly check to see if handrails are loose or broken. Make sure that both sides of any steps have handrails. Any raised decks and porches should have guardrails on the edges. Have any leaves, snow, or ice cleared regularly. Use sand or salt on walking paths during winter. Clean up any spills in your garage right away. This includes oil or grease spills. What can I do in the bathroom? Use night lights. Install grab bars by the toilet and in the tub and shower. Do not use towel bars as grab bars. Use non-skid mats or decals in the tub or shower. If you need to sit down in  the shower, use a plastic, non-slip stool. Keep the floor dry. Clean up any water that spills on the floor as soon as it happens. Remove soap buildup in the tub or shower regularly. Attach bath mats securely with double-sided non-slip rug tape. Do not have throw rugs and other things on the floor that can make you trip. What can I do in the bedroom? Use night lights. Make sure that you have a light by your bed that is easy to reach. Do not use any sheets or blankets that are too big for your bed. They should not hang down onto the floor. Have a firm chair that has side arms. You can use this for support while you get dressed. Do not have throw rugs and other things on the floor that can make you trip. What can I do in the kitchen? Clean up any spills right away. Avoid walking on wet floors. Keep items that you use a lot in easy-to-reach places. If you need to reach something above you, use a strong step stool that has a grab bar. Keep electrical cords out of the way. Do not use floor polish or wax that makes floors slippery. If you must use wax, use non-skid floor wax. Do not have throw rugs and other things on the floor that can make you trip. What can I do with my stairs? Do not leave any items on the stairs. Make sure  that there are handrails on both sides of the stairs and use them. Fix handrails that are broken or loose. Make sure that handrails are as long as the stairways. Check any carpeting to make sure that it is firmly attached to the stairs. Fix any carpet that is loose or worn. Avoid having throw rugs at the top or bottom of the stairs. If you do have throw rugs, attach them to the floor with carpet tape. Make sure that you have a light switch at the top of the stairs and the bottom of the stairs. If you do not have them, ask someone to add them for you. What else can I do to help prevent falls? Wear shoes that: Do not have high heels. Have rubber bottoms. Are comfortable  and fit you well. Are closed at the toe. Do not wear sandals. If you use a stepladder: Make sure that it is fully opened. Do not climb a closed stepladder. Make sure that both sides of the stepladder are locked into place. Ask someone to hold it for you, if possible. Clearly mark and make sure that you can see: Any grab bars or handrails. First and last steps. Where the edge of each step is. Use tools that help you move around (mobility aids) if they are needed. These include: Canes. Walkers. Scooters. Crutches. Turn on the lights when you go into a dark area. Replace any light bulbs as soon as they burn out. Set up your furniture so you have a clear path. Avoid moving your furniture around. If any of your floors are uneven, fix them. If there are any pets around you, be aware of where they are. Review your medicines with your doctor. Some medicines can make you feel dizzy. This can increase your chance of falling. Ask your doctor what other things that you can do to help prevent falls. This information is not intended to replace advice given to you by your health care provider. Make sure you discuss any questions you have with your health care provider. Document Released: 07/11/2009 Document Revised: 02/20/2016 Document Reviewed: 10/19/2014 Elsevier Interactive Patient Education  2017 [employer].

## 2021-11-12 ENCOUNTER — Other Ambulatory Visit: Payer: Self-pay | Admitting: Family Medicine

## 2021-11-12 DIAGNOSIS — E785 Hyperlipidemia, unspecified: Secondary | ICD-10-CM

## 2021-11-20 ENCOUNTER — Other Ambulatory Visit: Payer: Self-pay | Admitting: Family Medicine

## 2021-11-20 DIAGNOSIS — I10 Essential (primary) hypertension: Secondary | ICD-10-CM

## 2021-12-24 LAB — CBC WITH DIFFERENTIAL/PLATELET
Basophils Absolute: 0.1 10*3/uL (ref 0.0–0.2)
Basos: 1 %
EOS (ABSOLUTE): 0.3 10*3/uL (ref 0.0–0.4)
Eos: 3 %
Hematocrit: 44.1 % (ref 34.0–46.6)
Hemoglobin: 15.4 g/dL (ref 11.1–15.9)
Immature Grans (Abs): 0 10*3/uL (ref 0.0–0.1)
Immature Granulocytes: 0 %
Lymphocytes Absolute: 2.6 10*3/uL (ref 0.7–3.1)
Lymphs: 28 %
MCH: 30.7 pg (ref 26.6–33.0)
MCHC: 34.9 g/dL (ref 31.5–35.7)
MCV: 88 fL (ref 79–97)
Monocytes Absolute: 0.6 10*3/uL (ref 0.1–0.9)
Monocytes: 7 %
Neutrophils Absolute: 5.6 10*3/uL (ref 1.4–7.0)
Neutrophils: 61 %
Platelets: 244 10*3/uL (ref 150–450)
RBC: 5.01 x10E6/uL (ref 3.77–5.28)
RDW: 13.2 % (ref 11.7–15.4)
WBC: 9.1 10*3/uL (ref 3.4–10.8)

## 2021-12-24 LAB — COMPREHENSIVE METABOLIC PANEL
ALT: 17 IU/L (ref 0–32)
AST: 18 IU/L (ref 0–40)
Albumin/Globulin Ratio: 2.1 (ref 1.2–2.2)
Albumin: 4.4 g/dL (ref 3.7–4.7)
Alkaline Phosphatase: 78 IU/L (ref 44–121)
BUN/Creatinine Ratio: 20 (ref 12–28)
BUN: 25 mg/dL (ref 8–27)
Bilirubin Total: 0.7 mg/dL (ref 0.0–1.2)
CO2: 25 mmol/L (ref 20–29)
Calcium: 9.8 mg/dL (ref 8.7–10.3)
Chloride: 95 mmol/L — ABNORMAL LOW (ref 96–106)
Creatinine, Ser: 1.27 mg/dL — ABNORMAL HIGH (ref 0.57–1.00)
Globulin, Total: 2.1 g/dL (ref 1.5–4.5)
Glucose: 182 mg/dL — ABNORMAL HIGH (ref 70–99)
Potassium: 3.9 mmol/L (ref 3.5–5.2)
Sodium: 141 mmol/L (ref 134–144)
Total Protein: 6.5 g/dL (ref 6.0–8.5)
eGFR: 45 mL/min/{1.73_m2} — ABNORMAL LOW (ref >59)

## 2021-12-24 LAB — HEMOGLOBIN A1C
Est. average glucose Bld gHb Est-mCnc: 171 mg/dL
Hgb A1c MFr Bld: 7.6 % — ABNORMAL HIGH (ref 4.8–5.6)

## 2021-12-24 LAB — TSH: TSH: 1.14 u[IU]/mL (ref 0.450–4.500)

## 2021-12-24 LAB — LIPID PANEL
Chol/HDL Ratio: 3.8 ratio (ref 0.0–4.4)
Cholesterol, Total: 138 mg/dL (ref 100–199)
HDL: 36 mg/dL — ABNORMAL LOW (ref >39)
LDL Chol Calc (NIH): 71 mg/dL (ref 0–99)
Triglycerides: 185 mg/dL — ABNORMAL HIGH (ref 0–149)
VLDL Cholesterol Cal: 31 mg/dL (ref 5–40)

## 2021-12-29 ENCOUNTER — Ambulatory Visit: Payer: Medicare PPO | Admitting: Family Medicine

## 2021-12-29 VITALS — BP 133/76 | HR 58 | Temp 97.2°F | Ht 63.0 in | Wt 218.0 lb

## 2021-12-29 DIAGNOSIS — E119 Type 2 diabetes mellitus without complications: Secondary | ICD-10-CM | POA: Diagnosis not present

## 2021-12-29 DIAGNOSIS — I10 Essential (primary) hypertension: Secondary | ICD-10-CM

## 2021-12-29 MED ORDER — OZEMPIC (0.25 OR 0.5 MG/DOSE) 2 MG/1.5ML ~~LOC~~ SOPN: 0.5000 mg | PEN_INJECTOR | SUBCUTANEOUS | 0 refills | Status: DC

## 2021-12-29 NOTE — Assessment & Plan Note (Signed)
Needs better control.  A1c 7.6.   ?Continue glipizide.  Adding Ozempic. ?

## 2021-12-29 NOTE — Progress Notes (Signed)
? ?Subjective:  ?Patient ID: Amy Hendrix, female    DOB: [birth year]  Age: 71 y.o. MRN: [id number] ? ?CC: ?Chief Complaint  ?Patient presents with  ? Diabetes  ?  Taking metformin 500 1 tab and glipizide   ? ? ?HPI: ? ?71 year old female with hypertension, diabetes, hypothyroidism, CKD, hyperlipidemia presents for follow-up. ? ?Most recent A1c was elevated at 7.6.  Patient is currently taking 500 mg of metformin daily as well as glipizide 5 mg twice daily.  No reports of hypoglycemia.  We will need to discuss additional treatment options today to get her A1c at goal. ? ?Hypertension is stable and at goal. ? ?Most recent lipid panel revealed LDL of 71.  Elevated triglycerides.  Likely secondary to diabetes. ? ?Patient Active Problem List  ? Diagnosis Date Noted  ? Anxiety and depression 09/30/2021  ? CKD (chronic kidney disease) stage 3, GFR 30-59 ml/min (HCC) 09/30/2021  ? Hyperlipidemia 09/30/2021  ? Diabetes mellitus without complication (HCC) 02/21/2013  ? Essential hypertension, benign 02/21/2013  ? Sarcoidosis 02/21/2013  ? Hypothyroidism 02/21/2013  ? ? ?Social Hx   ?Social History  ? ?Socioeconomic History  ? Marital status: Married  ?  Spouse name: Greggory Stallion  ? Number of children: 2  ? Years of education: Not on file  ? Highest education level: Not on file  ?Occupational History  ? Not on file  ?Tobacco Use  ? Smoking status: Never  ? Smokeless tobacco: Never  ?Substance and Sexual Activity  ? Alcohol use: No  ? Drug use: No  ? Sexual activity: Not on file  ?Other Topics Concern  ? Not on file  ?Social History Narrative  ? Married x 38 years in June 2023.  ? ?Social Determinants of Health  ? ?Financial Resource Strain: Low Risk   ? Difficulty of Paying Living Expenses: Not hard at all  ?Food Insecurity: No Food Insecurity  ? Worried About Programme researcher, broadcasting/film/video in the Last Year: Never true  ? Ran Out of Food in the Last Year: Never true  ?Transportation Needs: No Transportation Needs  ? Lack of Transportation  (Medical): No  ? Lack of Transportation (Non-Medical): No  ?Physical Activity: Inactive  ? Days of Exercise per Week: 0 days  ? Minutes of Exercise per Session: 0 min  ?Stress: No Stress Concern Present  ? Feeling of Stress : Not at all  ?Social Connections: Moderately Integrated  ? Frequency of Communication with Friends and Family: More than three times a week  ? Frequency of Social Gatherings with Friends and Family: More than three times a week  ? Attends Religious Services: 1 to 4 times per year  ? Active Member of Clubs or Organizations: No  ? Attends Banker Meetings: Never  ? Marital Status: Married  ? ? ?Review of Systems  ?Constitutional: Negative.   ?Respiratory: Negative.    ?Cardiovascular: Negative.   ? ? ?Objective:  ?BP 133/76   Pulse (!) 58   Temp (!) 97.2 ?F (36.2 ?C)   Ht 5\' 3"  (1.6 m)   Wt 218 lb (98.9 kg)   SpO2 97%   BMI 38.62 kg/m?  ? ? ?  12/29/2021  ?  9:57 AM 11/04/2021  ?  9:07 AM 09/30/2021  ?  9:57 AM  ?BP/Weight  ?Systolic BP 133  11/28/2021  ?Diastolic BP 76  74  ?Wt. (Lbs) 218 213 213.8  ?BMI 38.62 kg/m2 37.73 kg/m2 37.87 kg/m2  ? ? ?Physical Exam ?Vitals and nursing  note reviewed.  ?Constitutional:   ?   General: She is not in acute distress. ?   Appearance: Normal appearance. She is obese.  ?HENT:  ?   Head: Normocephalic and atraumatic.  ?Eyes:  ?   General:     ?   Right eye: No discharge.     ?   Left eye: No discharge.  ?   Conjunctiva/sclera: Conjunctivae normal.  ?Cardiovascular:  ?   Rate and Rhythm: Normal rate and regular rhythm.  ?Pulmonary:  ?   Effort: Pulmonary effort is normal.  ?   Breath sounds: Normal breath sounds. No wheezing, rhonchi or rales.  ?Neurological:  ?   Mental Status: She is alert.  ?Psychiatric:     ?   Mood and Affect: Mood normal.     ?   Behavior: Behavior normal.  ? ? ?Lab Results  ?Component Value Date  ? WBC 9.1 12/23/2021  ? HGB 15.4 12/23/2021  ? HCT 44.1 12/23/2021  ? PLT 244 12/23/2021  ? GLUCOSE 182 (H) 12/23/2021  ? CHOL 138  12/23/2021  ? TRIG 185 (H) 12/23/2021  ? HDL 36 (L) 12/23/2021  ? LDLCALC 71 12/23/2021  ? ALT 17 12/23/2021  ? AST 18 12/23/2021  ? NA 141 12/23/2021  ? K 3.9 12/23/2021  ? CL 95 (L) 12/23/2021  ? CREATININE 1.27 (H) 12/23/2021  ? BUN 25 12/23/2021  ? CO2 25 12/23/2021  ? TSH 1.140 12/23/2021  ? HGBA1C 7.6 (H) 12/23/2021  ? MICROALBUR 0.8 08/28/2014  ? ? ? ?Assessment & Plan:  ? ?Problem List Items Addressed This Visit   ? ?  ? Cardiovascular and Mediastinum  ? Essential hypertension, benign - Primary  ?  Stable/well-controlled.  Continue indapamide and atenolol. ?  ?  ?  ? Endocrine  ? Diabetes mellitus without complication (HCC)  ?  Needs better control.  A1c 7.6.   ?Continue glipizide.  Adding Ozempic. ?  ?  ? Relevant Medications  ? Semaglutide,0.25 or 0.5MG /DOS, (OZEMPIC, 0.25 OR 0.5 MG/DOSE,) 2 MG/1.5ML SOPN  ? Other Relevant Orders  ? Hemoglobin A1c  ? ? ?Meds ordered this encounter  ?Medications  ? Semaglutide,0.25 or 0.5MG /DOS, (OZEMPIC, 0.25 OR 0.5 MG/DOSE,) 2 MG/1.5ML SOPN  ?  Sig: Inject 0.5 mg into the skin once a week.  ?  Dispense:  1.5 mL  ?  Refill:  0  ? ? ?Follow-up:  3 months ? ?Everlene Other DO ? Family Medicine ? ?

## 2021-12-29 NOTE — Assessment & Plan Note (Signed)
Stable/well-controlled.  Continue indapamide and atenolol. ?

## 2021-12-29 NOTE — Patient Instructions (Signed)
A1C today . ? ?Stop the Metformin  ? ?Continue the Glipizide. ? ?Follow up in 3 months.  ? ?Take care ? ?Dr. Adriana Simas  ?

## 2022-01-19 ENCOUNTER — Other Ambulatory Visit: Payer: Self-pay | Admitting: Family Medicine

## 2022-01-19 DIAGNOSIS — E039 Hypothyroidism, unspecified: Secondary | ICD-10-CM

## 2022-01-26 ENCOUNTER — Other Ambulatory Visit: Payer: Self-pay | Admitting: Family Medicine

## 2022-02-04 ENCOUNTER — Other Ambulatory Visit: Payer: Self-pay | Admitting: Family Medicine

## 2022-02-05 ENCOUNTER — Other Ambulatory Visit: Payer: Self-pay | Admitting: Family Medicine

## 2022-02-05 DIAGNOSIS — F32A Depression, unspecified: Secondary | ICD-10-CM

## 2022-02-05 MED ORDER — SEMAGLUTIDE (1 MG/DOSE) 4 MG/3ML ~~LOC~~ SOPN
1.0000 mg | PEN_INJECTOR | SUBCUTANEOUS | 0 refills | Status: DC
Start: 2022-02-05 — End: 2022-02-25

## 2022-02-05 NOTE — Telephone Encounter (Signed)
Left message for patient to return the call for additional details and recommendations.   

## 2022-02-17 ENCOUNTER — Other Ambulatory Visit: Payer: Self-pay | Admitting: Family Medicine

## 2022-02-17 DIAGNOSIS — I10 Essential (primary) hypertension: Secondary | ICD-10-CM

## 2022-02-17 DIAGNOSIS — E785 Hyperlipidemia, unspecified: Secondary | ICD-10-CM

## 2022-02-25 ENCOUNTER — Other Ambulatory Visit: Payer: Self-pay | Admitting: Family Medicine

## 2022-04-01 ENCOUNTER — Other Ambulatory Visit: Payer: Self-pay | Admitting: Family Medicine

## 2022-04-01 NOTE — Telephone Encounter (Signed)
Patient states she has been doing good on the 1 mg. No complaints.

## 2022-05-04 ENCOUNTER — Other Ambulatory Visit: Payer: Self-pay | Admitting: Family Medicine

## 2022-05-06 ENCOUNTER — Other Ambulatory Visit: Payer: Self-pay | Admitting: Family Medicine

## 2022-05-06 DIAGNOSIS — F32A Depression, unspecified: Secondary | ICD-10-CM

## 2022-05-19 ENCOUNTER — Other Ambulatory Visit: Payer: Self-pay | Admitting: Family Medicine

## 2022-05-22 ENCOUNTER — Other Ambulatory Visit: Payer: Self-pay | Admitting: Family Medicine

## 2022-05-22 DIAGNOSIS — I10 Essential (primary) hypertension: Secondary | ICD-10-CM

## 2022-05-25 ENCOUNTER — Other Ambulatory Visit: Payer: Self-pay | Admitting: Family Medicine

## 2022-06-02 ENCOUNTER — Ambulatory Visit: Payer: Medicare PPO | Admitting: Family Medicine

## 2022-06-24 ENCOUNTER — Other Ambulatory Visit: Payer: Self-pay | Admitting: Family Medicine

## 2022-06-24 DIAGNOSIS — E785 Hyperlipidemia, unspecified: Secondary | ICD-10-CM

## 2022-06-24 DIAGNOSIS — E119 Type 2 diabetes mellitus without complications: Secondary | ICD-10-CM | POA: Diagnosis not present

## 2022-06-25 LAB — HEMOGLOBIN A1C
Est. average glucose Bld gHb Est-mCnc: 114 mg/dL
Hgb A1c MFr Bld: 5.6 % (ref 4.8–5.6)

## 2022-06-29 ENCOUNTER — Encounter: Payer: Self-pay | Admitting: Family Medicine

## 2022-06-29 ENCOUNTER — Ambulatory Visit: Payer: Medicare PPO | Admitting: Family Medicine

## 2022-06-29 VITALS — BP 116/64 | HR 63 | Temp 97.5°F | Wt 201.2 lb

## 2022-06-29 DIAGNOSIS — G8929 Other chronic pain: Secondary | ICD-10-CM | POA: Diagnosis not present

## 2022-06-29 DIAGNOSIS — N1831 Chronic kidney disease, stage 3a: Secondary | ICD-10-CM

## 2022-06-29 DIAGNOSIS — E1122 Type 2 diabetes mellitus with diabetic chronic kidney disease: Secondary | ICD-10-CM | POA: Diagnosis not present

## 2022-06-29 DIAGNOSIS — M79671 Pain in right foot: Secondary | ICD-10-CM

## 2022-06-29 DIAGNOSIS — R21 Rash and other nonspecific skin eruption: Secondary | ICD-10-CM | POA: Insufficient documentation

## 2022-06-29 DIAGNOSIS — I10 Essential (primary) hypertension: Secondary | ICD-10-CM | POA: Diagnosis not present

## 2022-06-29 DIAGNOSIS — E782 Mixed hyperlipidemia: Secondary | ICD-10-CM

## 2022-06-29 DIAGNOSIS — E1129 Type 2 diabetes mellitus with other diabetic kidney complication: Secondary | ICD-10-CM | POA: Insufficient documentation

## 2022-06-29 DIAGNOSIS — N1832 Chronic kidney disease, stage 3b: Secondary | ICD-10-CM | POA: Diagnosis not present

## 2022-06-29 MED ORDER — TRIAMCINOLONE ACETONIDE 0.5 % EX OINT: 1.0000 | TOPICAL_OINTMENT | Freq: Two times a day (BID) | CUTANEOUS | 0 refills | Status: DC

## 2022-06-29 MED ORDER — POTASSIUM CHLORIDE CRYS ER 20 MEQ PO TBCR: 20.0000 meq | EXTENDED_RELEASE_TABLET | Freq: Every day | ORAL | 1 refills | Status: DC

## 2022-06-29 NOTE — Patient Instructions (Signed)
Xray at the hospital.  We will call with the results.   [facility] for urine to check for protein.  I sent in topical for the itching.  Consider getting your mammogram.  Follow up in 6 months.

## 2022-06-29 NOTE — Assessment & Plan Note (Signed)
Triamcinolone as directed. 

## 2022-06-29 NOTE — Assessment & Plan Note (Signed)
A1c well controlled.  Continue current medications.  Urine microalbumin today.

## 2022-06-29 NOTE — Assessment & Plan Note (Signed)
X-ray today for further evaluation.  Tylenol as needed.  No NSAIDs.  Patient asked about podiatry.  I advised that I am happy for her to see a podiatrist.  Patient states that she will arrange this on her own.

## 2022-06-29 NOTE — Progress Notes (Signed)
Subjective:  Patient ID: Amy Hendrix, female    DOB: [birth year]  Age: 71 y.o. MRN: [id number]  CC: Chief Complaint  Patient presents with   Diabetes    Foot pain in right foot; both feet at times. Pt states no issues with sugars at this time. Doing well on medications.    Hypertension    No issues at this time with blood pressure. Pt states she has felt good/healthy.     HPI:  71 year old female with type 2 diabetes, CKD, hyperlipidemia, anxiety and depression, hypothyroidism, hypertension presents for follow-up.  Hypertension is well controlled on atenolol and indapamide.  Type 2 diabetes is well controlled.  Most recent A1c 5.6.  She is doing well on Ozempic and glipizide.  Hyperlipidemia is well controlled on atorvastatin.  Patient reports ongoing foot pain.  Right greater than left.  She states that it is severe at times.  She has had this for many years.  Previously treated with NSAIDs but no longer using NSAIDs due to renal function.  She reports that it has been worsening.  The pain is located particularly at the MTP joints.  Additionally, patient states that she has significant itching particular when she is outdoors or comes in contact with something.  This happened yesterday when she was outdoors.  She reports itching around the wrists as well as the lower extremities.  There is evidence of excoriation.  Patient states that she would like to try something topical.  Lastly, discussed patient's preventative health care today.  Recommended mammogram.  Patient has not had a mammogram in 10 years.  She states that these are uncomfortable for her.  She states that she will consider.  Also recommended patient get her eyes checked.  She is in agreement to do so.  Declines hepatitis C screening.  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) 06/29/2022   Chronic foot pain, right 06/29/2022   Rash 06/29/2022   Anxiety and  depression 09/30/2021   Stage 3a chronic kidney disease (HCC) 09/30/2021   Hyperlipidemia 09/30/2021   Essential hypertension, benign 02/21/2013   Sarcoidosis 02/21/2013   Hypothyroidism 02/21/2013    Social Hx   Social History   Socioeconomic History   Marital status: Married    Spouse name: Greggory Stallion   Number of children: 2   Years of education: Not on file   Highest education level: Not on file  Occupational History   Not on file  Tobacco Use   Smoking status: Never   Smokeless tobacco: Never  Substance and Sexual Activity   Alcohol use: No   Drug use: No   Sexual activity: Not on file  Other Topics Concern   Not on file  Social History Narrative   Married x 38 years in June 2023.   Social Determinants of Health   Financial Resource Strain: Low Risk  (11/04/2021)   Overall Financial Resource Strain (CARDIA)    Difficulty of Paying Living Expenses: Not hard at all  Food Insecurity: No Food Insecurity (11/04/2021)   Hunger Vital Sign    Worried About Running Out of Food in the Last Year: Never true    Ran Out of Food in the Last Year: Never true  Transportation Needs: No Transportation Needs (11/04/2021)   PRAPARE - Administrator, Civil Service (Medical): No    Lack of Transportation (Non-Medical): No  Physical Activity: Inactive (11/04/2021)   Exercise Vital Sign  Days of Exercise per Week: 0 days    Minutes of Exercise per Session: 0 min  Stress: No Stress Concern Present (11/04/2021)   [employer] of Occupational Health - Occupational Stress Questionnaire    Feeling of Stress : Not at all  Social Connections: Moderately Integrated (11/04/2021)   Social Connection and Isolation Panel [NHANES]    Frequency of Communication with Friends and Family: More than three times a week    Frequency of Social Gatherings with Friends and Family: More than three times a week    Attends Religious Services: 1 to 4 times per year    Active Member of [employer] or  Organizations: No    Attends Engineer, structural: Never    Marital Status: Married    Review of Systems Per HPI  Objective:  BP 116/64   Pulse 63   Temp (!) 97.5 F (36.4 C)   Wt 201 lb 3.2 oz (91.3 kg)   SpO2 95%   BMI 35.64 kg/m      06/29/2022    9:40 AM 12/29/2021    9:57 AM 11/04/2021    9:07 AM  BP/Weight  Systolic BP 116 133   Diastolic BP 64 76   Wt. (Lbs) 201.2 218 213  BMI 35.64 kg/m2 38.62 kg/m2 37.73 kg/m2    Physical Exam Vitals and nursing note reviewed.  Constitutional:      General: She is not in acute distress.    Appearance: Normal appearance. She is obese.  HENT:     Head: Normocephalic and atraumatic.  Eyes:     General:        Right eye: No discharge.        Left eye: No discharge.     Conjunctiva/sclera: Conjunctivae normal.  Cardiovascular:     Rate and Rhythm: Normal rate and regular rhythm.  Pulmonary:     Effort: Pulmonary effort is normal.     Breath sounds: Normal breath sounds. No wheezing, rhonchi or rales.  Skin:    Comments: Lower extremities with raised, papular rash.  There is evidence of excoriation as well.  Neurological:     Mental Status: She is alert.  Psychiatric:        Mood and Affect: Mood normal.        Behavior: Behavior normal.   Diabetic foot exam performed today.  See quality metrics.  Lab Results  Component Value Date   WBC 9.1 12/23/2021   HGB 15.4 12/23/2021   HCT 44.1 12/23/2021   PLT 244 12/23/2021   GLUCOSE 182 (H) 12/23/2021   CHOL 138 12/23/2021   TRIG 185 (H) 12/23/2021   HDL 36 (L) 12/23/2021   LDLCALC 71 12/23/2021   ALT 17 12/23/2021   AST 18 12/23/2021   NA 141 12/23/2021   K 3.9 12/23/2021   CL 95 (L) 12/23/2021   CREATININE 1.27 (H) 12/23/2021   BUN 25 12/23/2021   CO2 25 12/23/2021   TSH 1.140 12/23/2021   HGBA1C 5.6 06/24/2022   MICROALBUR 0.8 08/28/2014     Assessment & Plan:   Problem List Items Addressed This Visit       Cardiovascular and Mediastinum    Essential hypertension, benign    Well-controlled.  Continue indapamide and atenolol.        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  Continue current medications.  Urine microalbumin today.        Musculoskeletal  and Integument   Rash    Triamcinolone as directed.        Genitourinary   Stage 3a chronic kidney disease ([place])   Relevant Orders   Microalbumin / creatinine urine ratio     Other   Chronic foot pain, right    X-ray today for further evaluation.  Tylenol as needed.  No NSAIDs.  Patient asked about podiatry.  I advised that I am happy for her to see a podiatrist.  Patient states that she will arrange this on her own.      Relevant Orders   DG Foot Complete Right   Hyperlipidemia    Well-controlled.  Continue statin.       Meds ordered this encounter  Medications   triamcinolone ointment (KENALOG) 0.5 %    Sig: Apply 1 Application topically 2 (two) times daily.    Dispense:  30 g    Refill:  0   potassium chloride SA (KLOR-CON M) 20 MEQ tablet    Sig: Take 1 tablet (20 mEq total) by mouth daily.    Dispense:  270 tablet    Refill:  1    Follow-up:  Return in about 6 months (around 12/29/2022).  [place]

## 2022-06-29 NOTE — Assessment & Plan Note (Signed)
Well controlled.  -Continue statin

## 2022-06-29 NOTE — Assessment & Plan Note (Signed)
Well-controlled.  Continue indapamide and atenolol.

## 2022-06-30 LAB — MICROALBUMIN / CREATININE URINE RATIO
Creatinine, Urine: 183.3 mg/dL
Microalb/Creat Ratio: 6 mg/g creat (ref 0–29)
Microalbumin, Urine: 10.5 ug/mL

## 2022-07-24 ENCOUNTER — Other Ambulatory Visit: Payer: Self-pay | Admitting: Family Medicine

## 2022-07-24 DIAGNOSIS — E039 Hypothyroidism, unspecified: Secondary | ICD-10-CM

## 2022-07-27 ENCOUNTER — Other Ambulatory Visit: Payer: Self-pay | Admitting: Family Medicine

## 2022-08-04 ENCOUNTER — Other Ambulatory Visit: Payer: Self-pay | Admitting: Family Medicine

## 2022-08-04 DIAGNOSIS — F32A Depression, unspecified: Secondary | ICD-10-CM

## 2022-08-19 ENCOUNTER — Other Ambulatory Visit: Payer: Self-pay | Admitting: Family Medicine

## 2022-08-19 DIAGNOSIS — I10 Essential (primary) hypertension: Secondary | ICD-10-CM

## 2022-08-24 ENCOUNTER — Other Ambulatory Visit: Payer: Self-pay | Admitting: Family Medicine

## 2022-09-29 ENCOUNTER — Other Ambulatory Visit: Payer: Self-pay | Admitting: Family Medicine

## 2022-10-19 ENCOUNTER — Other Ambulatory Visit: Payer: Self-pay | Admitting: Family Medicine

## 2022-10-22 ENCOUNTER — Other Ambulatory Visit: Payer: Self-pay | Admitting: Family Medicine

## 2022-10-22 DIAGNOSIS — E039 Hypothyroidism, unspecified: Secondary | ICD-10-CM

## 2022-10-23 ENCOUNTER — Other Ambulatory Visit: Payer: Self-pay | Admitting: *Deleted

## 2022-10-23 DIAGNOSIS — E039 Hypothyroidism, unspecified: Secondary | ICD-10-CM

## 2022-10-23 MED ORDER — LEVOTHYROXINE SODIUM 137 MCG PO TABS: ORAL_TABLET | ORAL | 0 refills | Status: DC

## 2022-10-23 MED ORDER — OZEMPIC (1 MG/DOSE) 4 MG/3ML ~~LOC~~ SOPN: PEN_INJECTOR | SUBCUTANEOUS | 0 refills | Status: DC

## 2022-11-06 ENCOUNTER — Other Ambulatory Visit: Payer: Self-pay

## 2022-11-06 DIAGNOSIS — F32A Depression, unspecified: Secondary | ICD-10-CM

## 2022-11-06 DIAGNOSIS — E785 Hyperlipidemia, unspecified: Secondary | ICD-10-CM

## 2022-11-06 DIAGNOSIS — E039 Hypothyroidism, unspecified: Secondary | ICD-10-CM

## 2022-11-06 DIAGNOSIS — I10 Essential (primary) hypertension: Secondary | ICD-10-CM

## 2022-11-06 MED ORDER — ATENOLOL 50 MG PO TABS: 50.0000 mg | ORAL_TABLET | Freq: Every day | ORAL | 1 refills | Status: DC

## 2022-11-06 MED ORDER — ESCITALOPRAM OXALATE 10 MG PO TABS: 10.0000 mg | ORAL_TABLET | Freq: Every day | ORAL | 0 refills | Status: DC

## 2022-11-06 MED ORDER — LEVOTHYROXINE SODIUM 137 MCG PO TABS: ORAL_TABLET | ORAL | 1 refills | Status: DC

## 2022-11-06 MED ORDER — POTASSIUM CHLORIDE CRYS ER 20 MEQ PO TBCR: 20.0000 meq | EXTENDED_RELEASE_TABLET | Freq: Every day | ORAL | 1 refills | Status: DC

## 2022-11-06 MED ORDER — COLESTIPOL HCL 1 G PO TABS: 2.0000 g | ORAL_TABLET | Freq: Every day | ORAL | 1 refills | Status: DC

## 2022-11-06 MED ORDER — ATORVASTATIN CALCIUM 40 MG PO TABS: ORAL_TABLET | ORAL | 1 refills | Status: DC

## 2022-11-06 MED ORDER — INDAPAMIDE 2.5 MG PO TABS: 2.5000 mg | ORAL_TABLET | Freq: Every day | ORAL | 1 refills | Status: DC

## 2022-11-06 MED ORDER — GLIPIZIDE 5 MG PO TABS: 5.0000 mg | ORAL_TABLET | Freq: Two times a day (BID) | ORAL | 0 refills | Status: DC

## 2022-11-12 NOTE — Patient Instructions (Addendum)
Amy Hendrix , Thank you for taking time to come for your Medicare Wellness Visit. I appreciate your ongoing commitment to your health goals. Please review the following plan we discussed and let me know if I can assist you in the future.   These are the goals we discussed:  Goals      Exercise 3x per week (30 min per time)     Move more and eat healthy. Work in garden.        This is a list of the screening recommended for you and due dates:  Health Maintenance  Topic Date Due   Eye exam for diabetics  Never done   Zoster (Shingles) Vaccine (1 of 2) Never done   COVID-19 Vaccine (5 - 2023-24 season) 09/11/2022   Mammogram  11/14/2023*   Hemoglobin A1C  12/23/2022   Yearly kidney function blood test for diabetes  12/24/2022   Yearly kidney health urinalysis for diabetes  06/30/2023   Complete foot exam   06/30/2023   Medicare Annual Wellness Visit  11/14/2023   DTaP/Tdap/Td vaccine (2 - Td or Tdap) 05/21/2026   Colon Cancer Screening  12/10/2027   Pneumonia Vaccine  Completed   Flu Shot  Completed   DEXA scan (bone density measurement)  Completed   HPV Vaccine  Aged Out   Hepatitis C Screening: USPSTF Recommendation to screen - Ages 64-79 yo.  Discontinued  *Topic was postponed. The date shown is not the original due date.    Advanced directives: Please bring a copy of your health care power of attorney and living will to the office to be added to your chart at your convenience.   Conditions/risks identified: Aim for 30 minutes of exercise or brisk walking, 6-8 glasses of water, and 5 servings of fruits and vegetables each day.   Next appointment: Follow up in one year for your annual wellness visit    Preventive Care 65 Years and Older, Female Preventive care refers to lifestyle choices and visits with your health care provider that can promote health and wellness. What does preventive care include? A yearly physical exam. This is also called an annual well check. Dental  exams once or twice a year. Routine eye exams. Ask your health care provider how often you should have your eyes checked. Personal lifestyle choices, including: Daily care of your teeth and gums. Regular physical activity. Eating a healthy diet. Avoiding tobacco and drug use. Limiting alcohol use. Practicing safe sex. Taking low-dose aspirin every day. Taking vitamin and mineral supplements as recommended by your health care provider. What happens during an annual well check? The services and screenings done by your health care provider during your annual well check will depend on your age, overall health, lifestyle risk factors, and family history of disease. Counseling  Your health care provider may ask you questions about your: Alcohol use. Tobacco use. Drug use. Emotional well-being. Home and relationship well-being. Sexual activity. Eating habits. History of falls. Memory and ability to understand (cognition). Work and work Statistician. Reproductive health. Screening  You may have the following tests or measurements: Height, weight, and BMI. Blood pressure. Lipid and cholesterol levels. These may be checked every 5 years, or more frequently if you are over 43 years old. Skin check. Lung cancer screening. You may have this screening every year starting at age 13 if you have a 30-pack-year history of smoking and currently smoke or have quit within the past 15 years. Fecal occult blood test (FOBT) of the  stool. You may have this test every year starting at age 77. Flexible sigmoidoscopy or colonoscopy. You may have a sigmoidoscopy every 5 years or a colonoscopy every 10 years starting at age 44. Hepatitis C blood test. Hepatitis B blood test. Sexually transmitted disease (STD) testing. Diabetes screening. This is done by checking your blood sugar (glucose) after you have not eaten for a while (fasting). You may have this done every 1-3 years. Bone density scan. This is done  to screen for osteoporosis. You may have this done starting at age 48. Mammogram. This may be done every 1-2 years. Talk to your health care provider about how often you should have regular mammograms. Talk with your health care provider about your test results, treatment options, and if necessary, the need for more tests. Vaccines  Your health care provider may recommend certain vaccines, such as: Influenza vaccine. This is recommended every year. Tetanus, diphtheria, and acellular pertussis (Tdap, Td) vaccine. You may need a Td booster every 10 years. Zoster vaccine. You may need this after age 18. Pneumococcal 13-valent conjugate (PCV13) vaccine. One dose is recommended after age 87. Pneumococcal polysaccharide (PPSV23) vaccine. One dose is recommended after age 74. Talk to your health care provider about which screenings and vaccines you need and how often you need them. This information is not intended to replace advice given to you by your health care provider. Make sure you discuss any questions you have with your health care provider. Document Released: 10/11/2015 Document Revised: 06/03/2016 Document Reviewed: 07/16/2015 Elsevier Interactive Patient Education  2017 [place] Prevention in the Home Falls can cause injuries. They can happen to people of all ages. There are many things you can do to make your home safe and to help prevent falls. What can I do on the outside of my home? Regularly fix the edges of walkways and driveways and fix any cracks. Remove anything that might make you trip as you walk through a door, such as a raised step or threshold. Trim any bushes or trees on the path to your home. Use bright outdoor lighting. Clear any walking paths of anything that might make someone trip, such as rocks or tools. Regularly check to see if handrails are loose or broken. Make sure that both sides of any steps have handrails. Any raised decks and porches should have  guardrails on the edges. Have any leaves, snow, or ice cleared regularly. Use sand or salt on walking paths during winter. Clean up any spills in your garage right away. This includes oil or grease spills. What can I do in the bathroom? Use night lights. Install grab bars by the toilet and in the tub and shower. Do not use towel bars as grab bars. Use non-skid mats or decals in the tub or shower. If you need to sit down in the shower, use a plastic, non-slip stool. Keep the floor dry. Clean up any water that spills on the floor as soon as it happens. Remove soap buildup in the tub or shower regularly. Attach bath mats securely with double-sided non-slip rug tape. Do not have throw rugs and other things on the floor that can make you trip. What can I do in the bedroom? Use night lights. Make sure that you have a light by your bed that is easy to reach. Do not use any sheets or blankets that are too big for your bed. They should not hang down onto the floor. Have a firm chair that  has side arms. You can use this for support while you get dressed. Do not have throw rugs and other things on the floor that can make you trip. What can I do in the kitchen? Clean up any spills right away. Avoid walking on wet floors. Keep items that you use a lot in easy-to-reach places. If you need to reach something above you, use a strong step stool that has a grab bar. Keep electrical cords out of the way. Do not use floor polish or wax that makes floors slippery. If you must use wax, use non-skid floor wax. Do not have throw rugs and other things on the floor that can make you trip. What can I do with my stairs? Do not leave any items on the stairs. Make sure that there are handrails on both sides of the stairs and use them. Fix handrails that are broken or loose. Make sure that handrails are as long as the stairways. Check any carpeting to make sure that it is firmly attached to the stairs. Fix any carpet  that is loose or worn. Avoid having throw rugs at the top or bottom of the stairs. If you do have throw rugs, attach them to the floor with carpet tape. Make sure that you have a light switch at the top of the stairs and the bottom of the stairs. If you do not have them, ask someone to add them for you. What else can I do to help prevent falls? Wear shoes that: Do not have high heels. Have rubber bottoms. Are comfortable and fit you well. Are closed at the toe. Do not wear sandals. If you use a stepladder: Make sure that it is fully opened. Do not climb a closed stepladder. Make sure that both sides of the stepladder are locked into place. Ask someone to hold it for you, if possible. Clearly mark and make sure that you can see: Any grab bars or handrails. First and last steps. Where the edge of each step is. Use tools that help you move around (mobility aids) if they are needed. These include: Canes. Walkers. Scooters. Crutches. Turn on the lights when you go into a dark area. Replace any light bulbs as soon as they burn out. Set up your furniture so you have a clear path. Avoid moving your furniture around. If any of your floors are uneven, fix them. If there are any pets around you, be aware of where they are. Review your medicines with your doctor. Some medicines can make you feel dizzy. This can increase your chance of falling. Ask your doctor what other things that you can do to help prevent falls. This information is not intended to replace advice given to you by your health care provider. Make sure you discuss any questions you have with your health care provider. Document Released: 07/11/2009 Document Revised: 02/20/2016 Document Reviewed: 10/19/2014 Elsevier Interactive Patient Education  2017 [employer].

## 2022-11-12 NOTE — Progress Notes (Signed)
Subjective:   Amy Hendrix is a 72 y.o. female who presents for Medicare Annual (Subsequent) preventive examination.  I connected with  [name] on 11/13/22 by a audio enabled telemedicine application and verified that I am speaking with the correct person using two identifiers.  Patient Location: Home  Provider Location: Office/Clinic  I discussed the limitations of evaluation and management by telemedicine. The patient expressed understanding and agreed to proceed.  Review of Systems     Cardiac Risk Factors include: advanced age (>41mn, >>85women);diabetes mellitus;dyslipidemia;hypertension;sedentary lifestyle     Objective:    Today's Vitals   11/13/22 0919  Weight: 201 lb (91.2 kg)  Height: 5' 3"$  (1.6 m)   Body mass index is 35.61 kg/m.     11/13/2022    9:58 AM 11/04/2021    9:29 AM 12/09/2017    6:50 AM 09/07/2017   11:41 AM  Advanced Directives  Does Patient Have a Medical Advance Directive? Yes Yes Yes Yes  Type of Advance Directive Living will;Healthcare Power of [place] Healthcare [place] of [place] will  Does patient want to make changes to medical advance directive? No - Patient declined   No - Patient declined  Copy of [place] Chart? No - copy requested No - copy requested No - copy requested No - copy requested    Current Medications (verified) Outpatient Encounter Medications as of 11/13/2022  Medication Sig   acetaminophen (TYLENOL) 650 MG CR tablet Take 1,300 mg by mouth every 8 (eight) hours as needed for pain.   Aromatic Inhalants (VICKS VAPOR INHALER IN) Place 1 Inhaler into the nose as needed (nasal congestion).   atenolol (TENORMIN) 50 MG tablet Take 1 tablet (50 mg total) by mouth daily.   atorvastatin (LIPITOR) 40 MG tablet TAKE (1) TABLET BY MOUTH ONCE DAILY.   blood glucose meter kit and supplies Use to test blood sugar once a day.  Please dispense based on pt insurance preference. DX: E11.9   clonazePAM (KLONOPIN) 1 MG tablet Take 1/2-1 tablet po q day  prn stress   colestipol (COLESTID) 1 g tablet Take 2 tablets (2 g total) by mouth daily.   diphenhydrAMINE (BENADRYL) 25 mg capsule Take 25 mg by mouth at bedtime as needed for allergies.   escitalopram (LEXAPRO) 10 MG tablet Take 1 tablet (10 mg total) by mouth daily.   glipiZIDE (GLUCOTROL) 5 MG tablet Take 1 tablet (5 mg total) by mouth 2 (two) times daily.   indapamide (LOZOL) 2.5 MG tablet Take 1 tablet (2.5 mg total) by mouth daily.   Lancets (ONETOUCH DELICA PLUS L123XX123 MISC USE TO TEST BLOOD SUGAR ONCE DAILY AS DIRECTED.   levothyroxine (SYNTHROID) 137 MCG tablet TAKE 1 TABLET BEFORE BREAKFAST   ONETOUCH ULTRA test strip USE TO TEST BLOOD SUGAR ONCE DAILY.   potassium chloride SA (KLOR-CON M) 20 MEQ tablet Take 1 tablet (20 mEq total) by mouth daily.   Semaglutide, 1 MG/DOSE, (OZEMPIC, 1 MG/DOSE,) 4 MG/3ML SOPN Inject 1 mg as directed once a week.   triamcinolone cream (KENALOG) 0.1 % APPLY TO AFFECTED AREAS TWICE DAILY.   triamcinolone ointment (KENALOG) 0.5 % APPLY TO AFFECTED AREA TOPICALLY 2 TIMES DAILY.   No facility-administered encounter medications on file as of 11/13/2022.    Allergies (verified) Patient has no known allergies.   History: Past Medical History:  Diagnosis Date   Allergic rhinitis    Arthritis  Depression    Hypertension    Hypothyroid    PONV (postoperative nausea and vomiting)    hard time waking up w/ nasal surgery   Sarcoidosis    Sinusitis    Type 2 diabetes mellitus ([place])    Past Surgical History:  Procedure Laterality Date   APPENDECTOMY     CESAREAN SECTION     CHOLECYSTECTOMY     COLONOSCOPY N/A 12/09/2017   Procedure: COLONOSCOPY;  Surgeon: Rogene Houston, MD;  [facility];  Service: Endoscopy;  Laterality: N/A;  730   EYE SURGERY Bilateral 2015   cataract surgery   FOOT SURGERY Bilateral mid  1990's   HAND SURGERY Left 2012   debridement/incision   INSERTION OF MESH N/A 09/10/2017   Procedure: INSERTION OF MESH;  Surgeon: Judeth Horn, MD;  Location: [facility];  Service: General;  Laterality: N/A;   NASAL SINUS SURGERY  [id number]   UMBILICAL HERNIA REPAIR N/A 09/10/2017   Procedure: LAPAROSCOPIC UMBILICAL HERNIA;  Surgeon: Judeth Horn, MD;  Location: [facility];  Service: General;  Laterality: N/A;   No family history on file. Social History   Socioeconomic History   Marital status: Married    Spouse name: Iona Beard   Number of children: 2   Years of education: Not on file   Highest education level: Not on file  Occupational History   Not on file  Tobacco Use   Smoking status: Never   Smokeless tobacco: Never  Substance and Sexual Activity   Alcohol use: No   Drug use: No   Sexual activity: Not on file  Other Topics Concern   Not on file  Social History Narrative   Married x 38 years in June 2023.   Social Determinants of Health   Financial Resource Strain: Low Risk  (11/13/2022)   Overall Financial Resource Strain (CARDIA)    Difficulty of Paying Living Expenses: Not hard at all  Food Insecurity: No Food Insecurity (11/13/2022)   Hunger Vital Sign    Worried About Running Out of Food in the Last Year: Never true    Ran Out of Food in the Last Year: Never true  Transportation Needs: No Transportation Needs (11/13/2022)   PRAPARE - Hydrologist (Medical): No    Lack of Transportation (Non-Medical): No  Physical Activity: Inactive (11/13/2022)   Exercise Vital Sign    Days of Exercise per Week: 0 days    Minutes of Exercise per Session: 0 min  Stress: No Stress Concern Present (11/13/2022)   [place]    Feeling of Stress : Not at all  Social Connections: Moderately Integrated (11/13/2022)   Social Connection and Isolation Panel [NHANES]    Frequency of Communication with Friends  and Family: More than three times a week    Frequency of Social Gatherings with Friends and Family: Three times a week    Attends Religious Services: 1 to 4 times per year    Active Member of Clubs or Organizations: No    Attends Archivist Meetings: Never    Marital Status: Married    Tobacco Counseling Counseling given: Not Answered   Clinical Intake:  Pre-visit preparation completed: Yes  Pain : No/denies pain  Diabetes: Yes CBG done?: No Did pt. bring in CBG monitor from home?: No  How often do you need to have someone help you when you read instructions, pamphlets, or other written materials from your  doctor or pharmacy?: 1 - Never  Diabetic?Yes   Nutrition Risk Assessment:  Has the patient had any N/V/D within the last 2 months?  No  Does the patient have any non-healing wounds?  No  Has the patient had any unintentional weight loss or weight gain?  No   Diabetes:  Is the patient diabetic?  Yes  If diabetic, was a CBG obtained today?  No  Did the patient bring in their glucometer from home?  No  How often do you monitor your CBG's? Daily.   Financial Strains and Diabetes Management:  Are you having any financial strains with the device, your supplies or your medication? No .  Does the patient want to be seen by Chronic Care Management for management of their diabetes?  No  Would the patient like to be referred to a Nutritionist or for Diabetic Management?  No   Diabetic Exams:  Diabetic Eye Exam: Overdue for diabetic eye exam. Pt has been advised about the importance in completing this exam. Patient advised to call and schedule an eye exam. Diabetic Foot Exam: Completed 06/29/22   Interpreter Needed?: No  Information entered by :: Denman George LPN   Activities of Daily Living    11/13/2022    9:58 AM  In your present state of health, do you have any difficulty performing the following activities:  Hearing? 0  Vision? 0  Difficulty  concentrating or making decisions? 0  Walking or climbing stairs? 0  Dressing or bathing? 0  Doing errands, shopping? 0  Preparing Food and eating ? N  Using the Toilet? N  In the past six months, have you accidently leaked urine? N  Do you have problems with loss of bowel control? N  Managing your Medications? N  Managing your Finances? N  Housekeeping or managing your Housekeeping? N    Patient Care Team: Coral Spikes, DO as PCP - General (Family Medicine)  Indicate any recent Medical Services you may have received from other than [facility] providers in the past year (date may be approximate).     Assessment:   This is a routine wellness examination for [place].  Hearing/Vision screen Hearing Screening - Comments:: Denies hearing difficulties  Vision Screening - Comments:: No vision problems; patient will schedule diabetic eye exam soon   Dietary issues and exercise activities discussed: Current Exercise Habits: The patient does not participate in regular exercise at present   Goals Addressed   None    Depression Screen    11/13/2022    9:57 AM 12/29/2021   10:06 AM 11/04/2021    9:20 AM 10/24/2020   10:01 AM 06/18/2020    1:13 PM 05/23/2019    8:50 AM 11/23/2018   10:12 AM  PHQ 2/9 Scores  PHQ - 2 Score 0 0 0 0 6 4 0  PHQ- 9 Score    0 16 6 5    $ Fall Risk    11/13/2022    9:56 AM 12/29/2021   10:06 AM 11/04/2021    9:30 AM 05/14/2021    9:40 AM 01/10/2020    8:32 AM  Fall Risk   Falls in the past year? 0 0 1 1 0  Number falls in past yr: 0 0 0 0 0  Injury with Fall? 0 0 0 0 0  Risk for fall due to :  No Fall Risks History of fall(s);Impaired balance/gait Other (Comment)   Risk for fall due to: Comment    twisted in garden  hose   Follow up Falls prevention discussed;Education provided;Falls evaluation completed Falls evaluation completed Falls prevention discussed Falls evaluation completed;Education provided Falls evaluation completed    FALL RISK PREVENTION PERTAINING  TO THE HOME:  Any stairs in or around the home? Yes  If so, are there any without handrails? No  Home free of loose throw rugs in walkways, pet beds, electrical cords, etc? Yes  Adequate lighting in your home to reduce risk of falls? Yes   ASSISTIVE DEVICES UTILIZED TO PREVENT FALLS:  Life alert? No  Use of a cane, walker or w/c? No  Grab bars in the bathroom? Yes  Shower chair or bench in shower? No  Elevated toilet seat or a handicapped toilet? Yes   TIMED UP AND GO:  Was the test performed? No . Telephonic visit   Cognitive Function:        11/13/2022    9:59 AM 11/04/2021    9:36 AM  6CIT Screen  What Year? 0 points 0 points  What month? 0 points 0 points  What time? 0 points 0 points  Count back from 20 0 points 0 points  Months in reverse 0 points 0 points  Repeat phrase 0 points 0 points  Total Score 0 points 0 points    Immunizations Immunization History  Administered Date(s) Administered   Fluad Quad(high Dose 65+) 06/18/2020   Influenza,inj,Quad PF,6+ Mos 08/28/2014, 09/25/2015, 06/08/2016, 07/20/2017, 07/13/2018, 07/13/2019   Influenza-Unspecified 07/29/2012, 07/19/2013, 07/17/2022   PFIZER(Purple Top)SARS-COV-2 Vaccination 12/08/2019, 01/05/2020, 08/09/2020   Pneumococcal Conjugate-13 09/25/2015   Pneumococcal Polysaccharide-23 11/06/2008, 11/23/2018   Tdap 05/21/2016   Unspecified SARS-COV-2 Vaccination 07/17/2022    TDAP status: Up to date  Flu Vaccine status: Up to date  Pneumococcal vaccine status: Up to date  Covid-19 vaccine status: Information provided on how to obtain vaccines.   Qualifies for Shingles Vaccine? Yes   Zostavax completed No   Shingrix Completed?: No.    Education has been provided regarding the importance of this vaccine. Patient has been advised to call insurance company to determine out of pocket expense if they have not yet received this vaccine. Advised may also receive vaccine at local pharmacy or Health Dept. Verbalized  acceptance and understanding.  Screening Tests Health Maintenance  Topic Date Due   OPHTHALMOLOGY EXAM  Never done   Zoster Vaccines- Shingrix (1 of 2) Never done   COVID-19 Vaccine (5 - 2023-24 season) 09/11/2022   MAMMOGRAM  11/14/2023 (Originally 08/24/2014)   HEMOGLOBIN A1C  12/23/2022   Diabetic kidney evaluation - eGFR measurement  12/24/2022   Diabetic kidney evaluation - Urine ACR  06/30/2023   FOOT EXAM  06/30/2023   Medicare Annual Wellness (AWV)  11/14/2023   DTaP/Tdap/Td (2 - Td or Tdap) 05/21/2026   COLONOSCOPY (Pts 45-69yr Insurance coverage will need to be confirmed)  12/10/2027   Pneumonia Vaccine 72 Years old  Completed   INFLUENZA VACCINE  Completed   DEXA SCAN  Completed   HPV VACCINES  Aged Out   Hepatitis C Screening  Discontinued    Health Maintenance  Health Maintenance Due  Topic Date Due   OPHTHALMOLOGY EXAM  Never done   Zoster Vaccines- Shingrix (1 of 2) Never done   COVID-19 Vaccine (5 - 2023-24 season) 09/11/2022    Colorectal cancer screening: Type of screening: Colonoscopy. Completed 12/09/17. Repeat every 10 years  Mammogram status:  Patient declines at this time  Bone Density status: Completed 10/10/12. Results reflect: Bone density results: OSTEOPENIA. Repeat every  2 years.  Lung Cancer Screening: (Low Dose CT Chest recommended if Age 13-80 years, 30 pack-year currently smoking OR have quit w/in 15years.) does not qualify.   Lung Cancer Screening Referral: n/a   Additional Screening:  Hepatitis C Screening: does qualify; Completed at next office visit   Vision Screening: Recommended annual ophthalmology exams for early detection of glaucoma and other disorders of the eye. Is the patient up to date with their annual eye exam?  No  Who is the provider or what is the name of the office in which the patient attends annual eye exams? none If pt is not established with a provider, would they like to be referred to a provider to establish  care? No .   Dental Screening: Recommended annual dental exams for proper oral hygiene  Community Resource Referral / Chronic Care Management: CRR required this visit?  No   CCM required this visit?  No      Plan:     I have personally reviewed and noted the following in the patient's chart:   Medical and social history Use of alcohol, tobacco or illicit drugs  Current medications and supplements including opioid prescriptions. Patient is not currently taking opioid prescriptions. Functional ability and status Nutritional status Physical activity Advanced directives List of other physicians Hospitalizations, surgeries, and ER visits in previous 12 months Vitals Screenings to include cognitive, depression, and falls Referrals and appointments  In addition, I have reviewed and discussed with patient certain preventive protocols, quality metrics, and best practice recommendations. A written personalized care plan for preventive services as well as general preventive health recommendations were provided to patient.     Vanetta Mulders, [place]   [id number]   Due to this being a virtual visit, the after visit summary with patients personalized plan was offered to patient via mail or my-chart. per request, patient was mailed a copy of AVS  Nurse Notes: no concerns

## 2022-11-13 ENCOUNTER — Ambulatory Visit (INDEPENDENT_AMBULATORY_CARE_PROVIDER_SITE_OTHER): Payer: Medicare PPO

## 2022-11-13 VITALS — Ht 63.0 in | Wt 201.0 lb

## 2022-11-13 DIAGNOSIS — Z0000 Encounter for general adult medical examination without abnormal findings: Secondary | ICD-10-CM

## 2022-11-27 ENCOUNTER — Encounter: Payer: Self-pay | Admitting: Family Medicine

## 2022-11-27 ENCOUNTER — Ambulatory Visit (INDEPENDENT_AMBULATORY_CARE_PROVIDER_SITE_OTHER): Payer: Medicare PPO | Admitting: Family Medicine

## 2022-11-27 DIAGNOSIS — M79672 Pain in left foot: Secondary | ICD-10-CM | POA: Diagnosis not present

## 2022-11-27 DIAGNOSIS — G8929 Other chronic pain: Secondary | ICD-10-CM

## 2022-11-27 DIAGNOSIS — M79671 Pain in right foot: Secondary | ICD-10-CM | POA: Diagnosis not present

## 2022-11-27 MED ORDER — PREDNISONE 10 MG PO TABS: ORAL_TABLET | ORAL | 0 refills | Status: DC

## 2022-11-27 NOTE — Patient Instructions (Signed)
Xrays ordered.  Labs when you can.  Prednisone as prescribed.

## 2022-11-28 DIAGNOSIS — G8929 Other chronic pain: Secondary | ICD-10-CM | POA: Insufficient documentation

## 2022-11-28 NOTE — Progress Notes (Signed)
Subjective:  Patient ID: Amy Hendrix, female    DOB: January 24, 1951  Age: 72 y.o. MRN: [id number]  CC: Chief Complaint  Patient presents with   foot and ankle swelling    Chronic issue -Usually occurs 4 times a year some worse than others     HPI:  72 year old female presents for evaluation of the above.  Patient reports that this started over the past few days.  Worsened today.  She reports bilateral foot pain particularly at the MTP joints and of several of the toes.  No prior history of gout.  She is never had a uric acid level that I can see.  She reports that her pain is quite debilitating at this time and she is unable to walk.  She has had prior surgeries on both feet and has had prior fracture.  She cannot take NSAIDs due to chronic kidney disease.  No other complaints or concerns at this time.  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[place]) 0000000   Chronic foot pain, right 06/29/2022   Rash 06/29/2022   Anxiety and depression 09/30/2021   Stage 3a chronic kidney disease ([place]) 09/30/2021   Hyperlipidemia 09/30/2021   Essential hypertension, benign 02/21/2013   Sarcoidosis 02/21/2013   Hypothyroidism 02/21/2013    Social Hx   Social History   Socioeconomic History   Marital status: Married    Spouse name: Iona Beard   Number of children: 2   Years of education: Not on file   Highest education level: Not on file  Occupational History   Not on file  Tobacco Use   Smoking status: Never   Smokeless tobacco: Never  Substance and Sexual Activity   Alcohol use: No   Drug use: No   Sexual activity: Not on file  Other Topics Concern   Not on file  Social History Narrative   Married x 38 years in June 2023.   Social Determinants of Health   Financial Resource Strain: Low Risk  (11/13/2022)   Overall Financial Resource Strain (CARDIA)    Difficulty of Paying Living  Expenses: Not hard at all  Food Insecurity: No Food Insecurity (11/13/2022)   Hunger Vital Sign    Worried About Running Out of Food in the Last Year: Never true    Ran Out of Food in the Last Year: Never true  Transportation Needs: No Transportation Needs (11/13/2022)   PRAPARE - Hydrologist (Medical): No    Lack of Transportation (Non-Medical): No  Physical Activity: Inactive (11/13/2022)   Exercise Vital Sign    Days of Exercise per Week: 0 days    Minutes of Exercise per Session: 0 min  Stress: No Stress Concern Present (11/13/2022)   [place]    Feeling of Stress : Not at all  Social Connections: Moderately Integrated (11/13/2022)   Social Connection and Isolation Panel [NHANES]    Frequency of Communication with Friends and Family: More than three times a week    Frequency of Social Gatherings with Friends and Family: Three times a week    Attends Religious Services: 1 to 4 times per year    Active Member of Clubs or Organizations: No    Attends Archivist Meetings: Never    Marital Status: Married    Review of Systems Per HPI  Objective:  BP 99/66   Pulse 74   Temp (!) 97.5 F (36.4 C)   Ht '5\' 3"'$  (1.6 m)   Wt 201 lb (91.2 kg)   SpO2 97%   BMI 35.61 kg/m      11/27/2022    4:25 PM 11/13/2022    9:19 AM 06/29/2022    9:40 AM  BP/Weight  Systolic BP 99  99991111  Diastolic BP 66  64  Wt. (Lbs) 201 201 201.2  BMI 35.61 kg/m2 35.61 kg/m2 35.64 kg/m2    Physical Exam Constitutional:      Appearance: Normal appearance. She is obese.  HENT:     Head: Normocephalic and atraumatic.  Pulmonary:     Effort: Pulmonary effort is normal. No respiratory distress.  Musculoskeletal:     Comments: Tenderness of the first MTP joints bilaterally.  She has swelling and pain of one of her toes on the right foot and also one of her toes on the left foot.  Neurological:     Mental  Status: She is alert.  Psychiatric:        Mood and Affect: Mood normal.        Behavior: Behavior normal.     Lab Results  Component Value Date   WBC 9.1 12/23/2021   HGB 15.4 12/23/2021   HCT 44.1 12/23/2021   PLT 244 12/23/2021   GLUCOSE 182 (H) 12/23/2021   CHOL 138 12/23/2021   TRIG 185 (H) 12/23/2021   HDL 36 (L) 12/23/2021   LDLCALC 71 12/23/2021   ALT 17 12/23/2021   AST 18 12/23/2021   NA 141 12/23/2021   K 3.9 12/23/2021   CL 95 (L) 12/23/2021   CREATININE 1.27 (H) 12/23/2021   BUN 25 12/23/2021   CO2 25 12/23/2021   TSH 1.140 12/23/2021   HGBA1C 5.6 06/24/2022   MICROALBUR 0.8 08/28/2014     Assessment & Plan:   Problem List Items Addressed This Visit       Other   Chronic pain in left foot - Primary    X-rays ordered.  Placing on prednisone.      Relevant Medications   predniSONE (DELTASONE) 10 MG tablet   Other Relevant Orders   DG Foot Complete Left   Uric Acid   Chronic foot pain, right    X-rays ordered.  Placing on prednisone.      Relevant Medications   predniSONE (DELTASONE) 10 MG tablet   Other Relevant Orders   DG Foot Complete Right   Uric Acid    Meds ordered this encounter  Medications   predniSONE (DELTASONE) 10 MG tablet    Sig: 50 mg daily x 2 days, then 40 mg daily x 2 days, then 30 mg daily x 2 days, then 20 mg daily x 2 days, then 10 mg daily x 2 days.    Dispense:  30 tablet    Refill:  [place]

## 2022-11-28 NOTE — Assessment & Plan Note (Signed)
X-rays ordered.  Placing on prednisone.

## 2022-12-08 ENCOUNTER — Ambulatory Visit (HOSPITAL_COMMUNITY)
Admission: RE | Admit: 2022-12-08 | Discharge: 2022-12-08 | Disposition: A | Discharge status: Home / Self Care | Payer: Medicare PPO | Source: Ambulatory Visit | Attending: Family Medicine | Admitting: Family Medicine

## 2022-12-08 DIAGNOSIS — M79672 Pain in left foot: Secondary | ICD-10-CM | POA: Diagnosis not present

## 2022-12-08 DIAGNOSIS — G8929 Other chronic pain: Secondary | ICD-10-CM | POA: Diagnosis not present

## 2022-12-08 DIAGNOSIS — M79671 Pain in right foot: Secondary | ICD-10-CM | POA: Insufficient documentation

## 2022-12-08 DIAGNOSIS — M7732 Calcaneal spur, left foot: Secondary | ICD-10-CM | POA: Diagnosis not present

## 2022-12-08 DIAGNOSIS — M19071 Primary osteoarthritis, right ankle and foot: Secondary | ICD-10-CM | POA: Diagnosis not present

## 2022-12-09 LAB — URIC ACID: Uric Acid: 8.7 mg/dL — ABNORMAL HIGH (ref 3.1–7.9)

## 2022-12-14 ENCOUNTER — Ambulatory Visit: Payer: Medicare PPO | Admitting: Family Medicine

## 2022-12-14 VITALS — BP 114/74 | HR 77 | Temp 97.9°F | Ht 63.0 in | Wt 193.0 lb

## 2022-12-14 DIAGNOSIS — I10 Essential (primary) hypertension: Secondary | ICD-10-CM

## 2022-12-14 DIAGNOSIS — E039 Hypothyroidism, unspecified: Secondary | ICD-10-CM | POA: Diagnosis not present

## 2022-12-14 DIAGNOSIS — M109 Gout, unspecified: Secondary | ICD-10-CM | POA: Insufficient documentation

## 2022-12-14 DIAGNOSIS — N1831 Chronic kidney disease, stage 3a: Secondary | ICD-10-CM

## 2022-12-14 DIAGNOSIS — M1A9XX0 Chronic gout, unspecified, without tophus (tophi): Secondary | ICD-10-CM | POA: Diagnosis not present

## 2022-12-14 DIAGNOSIS — Z130 Encounter for screening for diseases of the blood and blood-forming organs and certain disorders involving the immune mechanism: Secondary | ICD-10-CM | POA: Diagnosis not present

## 2022-12-14 DIAGNOSIS — E1122 Type 2 diabetes mellitus with diabetic chronic kidney disease: Secondary | ICD-10-CM

## 2022-12-14 DIAGNOSIS — E782 Mixed hyperlipidemia: Secondary | ICD-10-CM | POA: Diagnosis not present

## 2022-12-14 MED ORDER — COLCHICINE 0.6 MG PO TABS: 0.6000 mg | ORAL_TABLET | Freq: Every day | ORAL | 1 refills | Status: DC

## 2022-12-14 MED ORDER — OZEMPIC (1 MG/DOSE) 4 MG/3ML ~~LOC~~ SOPN: PEN_INJECTOR | SUBCUTANEOUS | 0 refills | Status: DC

## 2022-12-14 MED ORDER — ALLOPURINOL 100 MG PO TABS: 50.0000 mg | ORAL_TABLET | Freq: Every day | ORAL | 1 refills | Status: DC

## 2022-12-14 NOTE — Assessment & Plan Note (Signed)
Stable.  Continue current medications.

## 2022-12-14 NOTE — Assessment & Plan Note (Signed)
Lipid panel today to assess.  Continue statin. 

## 2022-12-14 NOTE — Assessment & Plan Note (Signed)
Metabolic panel today to assess. 

## 2022-12-14 NOTE — Progress Notes (Signed)
Subjective:  Patient ID: Amy Hendrix, female    DOB: Sep 12, 1951  Age: 72 y.o. MRN: [id number]  CC: Chief Complaint  Patient presents with   Diabetes   Hypertension   Foot Pain    Bilateral comp prednisone    HPI:  72 year old female with hypertension, hypothyroidism, type 2 diabetes with chronic kidney disease, anxiety depression, hyperlipidemia, chronic pain of the feet/great toes presents for follow-up.  Hypertension is stable on atenolol and indapamide.  Type II diabetes has been well-controlled on Ozempic and glipizide.  Needs A1c.  Hyperlipidemia has been fairly well-controlled.  Last LDL was 71.  He is tolerating Lipitor.  Needs lipid panel today.  Patient's recent x-ray evaluation revealed degenerative changes of the right toes.  I recommended that she see podiatry again.  Patient is in agreement with this.  Additionally, uric acid level elevated.  Concerned that gout is playing a role.  We will discuss treatment today.  Patient Active Problem List   Diagnosis Date Noted   Gout 12/14/2022   Chronic pain in left foot 11/28/2022   Type 2 diabetes with kidney complications ([place]) 0000000   Chronic foot pain, right 06/29/2022   Anxiety and depression 09/30/2021   Stage 3a chronic kidney disease ([place]) 09/30/2021   Hyperlipidemia 09/30/2021   Essential hypertension, benign 02/21/2013   Sarcoidosis 02/21/2013   Hypothyroidism 02/21/2013    Social Hx   Social History   Socioeconomic History   Marital status: Married    Spouse name: Iona Beard   Number of children: 2   Years of education: Not on file   Highest education level: Not on file  Occupational History   Not on file  Tobacco Use   Smoking status: Never   Smokeless tobacco: Never  Substance and Sexual Activity   Alcohol use: No   Drug use: No   Sexual activity: Not on file  Other Topics Concern   Not on file  Social History Narrative   Married x 38 years in June 2023.   Social Determinants of  Health   Financial Resource Strain: Low Risk  (11/13/2022)   Overall Financial Resource Strain (CARDIA)    Difficulty of Paying Living Expenses: Not hard at all  Food Insecurity: No Food Insecurity (11/13/2022)   Hunger Vital Sign    Worried About Running Out of Food in the Last Year: Never true    Ran Out of Food in the Last Year: Never true  Transportation Needs: No Transportation Needs (11/13/2022)   PRAPARE - Hydrologist (Medical): No    Lack of Transportation (Non-Medical): No  Physical Activity: Inactive (11/13/2022)   Exercise Vital Sign    Days of Exercise per Week: 0 days    Minutes of Exercise per Session: 0 min  Stress: No Stress Concern Present (11/13/2022)   [place]    Feeling of Stress : Not at all  Social Connections: Moderately Integrated (11/13/2022)   Social Connection and Isolation Panel [NHANES]    Frequency of Communication with Friends and Family: More than three times a week    Frequency of Social Gatherings with Friends and Family: Three times a week    Attends Religious Services: 1 to 4 times per year    Active Member of Clubs or Organizations: No    Attends Archivist Meetings: Never    Marital Status: Married    Review of Systems  Constitutional: Negative.  Musculoskeletal:        Pain of the feet.   Objective:  BP 114/74   Pulse 77   Temp 97.9 F (36.6 C)   Ht 5\' 3"  (1.6 m)   Wt 193 lb (87.5 kg)   SpO2 96%   BMI 34.19 kg/m      12/14/2022    9:54 AM 11/27/2022    4:25 PM 11/13/2022    9:19 AM  BP/Weight  Systolic BP 99991111 99   Diastolic BP 74 66   Wt. (Lbs) 193 201 201  BMI 34.19 kg/m2 35.61 kg/m2 35.61 kg/m2    Physical Exam Vitals and nursing note reviewed.  Constitutional:      General: She is not in acute distress.    Appearance: Normal appearance.  HENT:     Head: Normocephalic and atraumatic.  Eyes:     General:         Right eye: No discharge.        Left eye: No discharge.     Conjunctiva/sclera: Conjunctivae normal.  Cardiovascular:     Rate and Rhythm: Normal rate and regular rhythm.  Pulmonary:     Effort: Pulmonary effort is normal.     Breath sounds: Normal breath sounds. No wheezing, rhonchi or rales.  Neurological:     Mental Status: She is alert.  Psychiatric:        Mood and Affect: Mood normal.        Behavior: Behavior normal.     Lab Results  Component Value Date   WBC 9.1 12/23/2021   HGB 15.4 12/23/2021   HCT 44.1 12/23/2021   PLT 244 12/23/2021   GLUCOSE 182 (H) 12/23/2021   CHOL 138 12/23/2021   TRIG 185 (H) 12/23/2021   HDL 36 (L) 12/23/2021   LDLCALC 71 12/23/2021   ALT 17 12/23/2021   AST 18 12/23/2021   NA 141 12/23/2021   K 3.9 12/23/2021   CL 95 (L) 12/23/2021   CREATININE 1.27 (H) 12/23/2021   BUN 25 12/23/2021   CO2 25 12/23/2021   TSH 1.140 12/23/2021   HGBA1C 5.6 06/24/2022   MICROALBUR 0.8 08/28/2014     Assessment & Plan:   Problem List Items Addressed This Visit       Cardiovascular and Mediastinum   Essential hypertension, benign    Stable.  Continue current medications.        Endocrine   Hypothyroidism    Stable.  TSH today.      Relevant Orders   TSH   Type 2 diabetes with kidney complications (HCC) - Primary    A1c has been well-controlled.  Checking today.  Continue current medications.      Relevant Medications   Semaglutide, 1 MG/DOSE, (OZEMPIC, 1 MG/DOSE,) 4 MG/3ML SOPN   Other Relevant Orders   CMP14+EGFR   Hemoglobin A1c     Genitourinary   Stage 3a chronic kidney disease (HCC)    Metabolic panel today to assess.        Other   Gout    Starting allopurinol and colchicine.  Renal dosing of allopurinol.      Hyperlipidemia    Lipid panel today to assess.  Continue statin.      Relevant Orders   Lipid panel   Other Visit Diagnoses     Screening for deficiency anemia       Relevant Orders   CBC        Meds ordered this encounter  Medications  Semaglutide, 1 MG/DOSE, (OZEMPIC, 1 MG/DOSE,) 4 MG/3ML SOPN    Sig: Inject 1 mg as directed once a week.    Dispense:  3 mL    Refill:  0   colchicine 0.6 MG tablet    Sig: Take 1 tablet (0.6 mg total) by mouth daily.    Dispense:  90 tablet    Refill:  1   allopurinol (ZYLOPRIM) 100 MG tablet    Sig: Take 0.5 tablets (50 mg total) by mouth daily.    Dispense:  45 tablet    Refill:  1    Follow-up:  Return in about 6 months (around 06/16/2023).  [place]

## 2022-12-14 NOTE — Patient Instructions (Signed)
Start the allopurinol and colchicine.  Labs today.  Follow up in 6 months.

## 2022-12-14 NOTE — Assessment & Plan Note (Signed)
Stable.  - TSH today   

## 2022-12-14 NOTE — Assessment & Plan Note (Signed)
Starting allopurinol and colchicine.  Renal dosing of allopurinol.

## 2022-12-14 NOTE — Assessment & Plan Note (Signed)
A1c has been well-controlled.  Checking today.  Continue current medications.

## 2022-12-15 LAB — CMP14+EGFR
ALT: 19 IU/L (ref 0–32)
AST: 17 IU/L (ref 0–40)
Albumin/Globulin Ratio: 1.9 (ref 1.2–2.2)
Albumin: 3.9 g/dL (ref 3.8–4.8)
Alkaline Phosphatase: 59 IU/L (ref 44–121)
BUN/Creatinine Ratio: 15 (ref 12–28)
BUN: 21 mg/dL (ref 8–27)
Bilirubin Total: 0.6 mg/dL (ref 0.0–1.2)
CO2: 24 mmol/L (ref 20–29)
Calcium: 10 mg/dL (ref 8.7–10.3)
Chloride: 102 mmol/L (ref 96–106)
Creatinine, Ser: 1.39 mg/dL — ABNORMAL HIGH (ref 0.57–1.00)
Globulin, Total: 2.1 g/dL (ref 1.5–4.5)
Glucose: 142 mg/dL — ABNORMAL HIGH (ref 70–99)
Potassium: 4.1 mmol/L (ref 3.5–5.2)
Sodium: 142 mmol/L (ref 134–144)
Total Protein: 6 g/dL (ref 6.0–8.5)
eGFR: 40 mL/min/{1.73_m2} — ABNORMAL LOW (ref >59)

## 2022-12-15 LAB — LIPID PANEL
Chol/HDL Ratio: 5.7 ratio — ABNORMAL HIGH (ref 0.0–4.4)
Cholesterol, Total: 210 mg/dL — ABNORMAL HIGH (ref 100–199)
HDL: 37 mg/dL — ABNORMAL LOW (ref >39)
LDL Chol Calc (NIH): 119 mg/dL — ABNORMAL HIGH (ref 0–99)
Triglycerides: 310 mg/dL — ABNORMAL HIGH (ref 0–149)
VLDL Cholesterol Cal: 54 mg/dL — ABNORMAL HIGH (ref 5–40)

## 2022-12-15 LAB — CBC
Hematocrit: 43.4 % (ref 34.0–46.6)
Hemoglobin: 14.7 g/dL (ref 11.1–15.9)
MCH: 29.5 pg (ref 26.6–33.0)
MCHC: 33.9 g/dL (ref 31.5–35.7)
MCV: 87 fL (ref 79–97)
Platelets: 273 10*3/uL (ref 150–450)
RBC: 4.98 x10E6/uL (ref 3.77–5.28)
RDW: 13.4 % (ref 11.7–15.4)
WBC: 9.7 10*3/uL (ref 3.4–10.8)

## 2022-12-15 LAB — HEMOGLOBIN A1C
Est. average glucose Bld gHb Est-mCnc: 126 mg/dL
Hgb A1c MFr Bld: 6 % — ABNORMAL HIGH (ref 4.8–5.6)

## 2022-12-15 LAB — TSH: TSH: 0.781 u[IU]/mL (ref 0.450–4.500)

## 2022-12-29 ENCOUNTER — Ambulatory Visit: Payer: Medicare PPO | Admitting: Family Medicine

## 2023-01-18 ENCOUNTER — Other Ambulatory Visit: Payer: Self-pay | Admitting: Family Medicine

## 2023-01-30 ENCOUNTER — Other Ambulatory Visit: Payer: Self-pay | Admitting: Family Medicine

## 2023-01-30 DIAGNOSIS — F32A Depression, unspecified: Secondary | ICD-10-CM

## 2023-02-01 ENCOUNTER — Other Ambulatory Visit: Payer: Self-pay | Admitting: Family Medicine

## 2023-02-01 DIAGNOSIS — F32A Depression, unspecified: Secondary | ICD-10-CM

## 2023-02-04 DIAGNOSIS — M7741 Metatarsalgia, right foot: Secondary | ICD-10-CM | POA: Diagnosis not present

## 2023-02-04 DIAGNOSIS — M79673 Pain in unspecified foot: Secondary | ICD-10-CM | POA: Diagnosis not present

## 2023-02-04 DIAGNOSIS — M7742 Metatarsalgia, left foot: Secondary | ICD-10-CM | POA: Diagnosis not present

## 2023-02-08 ENCOUNTER — Other Ambulatory Visit: Payer: Self-pay | Admitting: Family Medicine

## 2023-03-08 ENCOUNTER — Other Ambulatory Visit: Payer: Self-pay | Admitting: Family Medicine

## 2023-03-18 DIAGNOSIS — M7742 Metatarsalgia, left foot: Secondary | ICD-10-CM | POA: Diagnosis not present

## 2023-03-18 DIAGNOSIS — M79673 Pain in unspecified foot: Secondary | ICD-10-CM | POA: Diagnosis not present

## 2023-03-18 DIAGNOSIS — M7741 Metatarsalgia, right foot: Secondary | ICD-10-CM | POA: Diagnosis not present

## 2023-04-05 ENCOUNTER — Other Ambulatory Visit: Payer: Self-pay | Admitting: Family Medicine

## 2023-04-27 ENCOUNTER — Other Ambulatory Visit: Payer: Self-pay | Admitting: Family Medicine

## 2023-04-27 DIAGNOSIS — E785 Hyperlipidemia, unspecified: Secondary | ICD-10-CM

## 2023-04-27 DIAGNOSIS — I10 Essential (primary) hypertension: Secondary | ICD-10-CM

## 2023-05-03 ENCOUNTER — Other Ambulatory Visit: Payer: Self-pay | Admitting: Family Medicine

## 2023-05-06 ENCOUNTER — Other Ambulatory Visit: Payer: Self-pay | Admitting: Family Medicine

## 2023-05-06 ENCOUNTER — Other Ambulatory Visit: Payer: Self-pay

## 2023-05-06 DIAGNOSIS — F32A Depression, unspecified: Secondary | ICD-10-CM

## 2023-05-06 MED ORDER — ESCITALOPRAM OXALATE 10 MG PO TABS
10.0000 mg | ORAL_TABLET | Freq: Every day | ORAL | 3 refills | Status: DC
Start: 2023-05-06 — End: 2024-04-27

## 2023-05-14 ENCOUNTER — Telehealth: Payer: Self-pay | Admitting: Family Medicine

## 2023-05-14 NOTE — Telephone Encounter (Signed)
Patient would like to have labs done before she is seen again.

## 2023-05-17 ENCOUNTER — Ambulatory Visit: Payer: Medicare PPO | Admitting: Family Medicine

## 2023-05-17 NOTE — Telephone Encounter (Signed)
Cook, Jayce G, DO     CBC, CMP, Lipid, A1C, TSH + Free T4, Urine Microalbumin.

## 2023-05-18 ENCOUNTER — Other Ambulatory Visit: Payer: Self-pay

## 2023-05-18 DIAGNOSIS — Z130 Encounter for screening for diseases of the blood and blood-forming organs and certain disorders involving the immune mechanism: Secondary | ICD-10-CM

## 2023-05-18 DIAGNOSIS — Z79899 Other long term (current) drug therapy: Secondary | ICD-10-CM

## 2023-05-18 DIAGNOSIS — E782 Mixed hyperlipidemia: Secondary | ICD-10-CM

## 2023-05-18 DIAGNOSIS — I10 Essential (primary) hypertension: Secondary | ICD-10-CM

## 2023-05-18 DIAGNOSIS — E039 Hypothyroidism, unspecified: Secondary | ICD-10-CM

## 2023-05-18 DIAGNOSIS — N1831 Chronic kidney disease, stage 3a: Secondary | ICD-10-CM

## 2023-05-18 NOTE — Telephone Encounter (Signed)
Called pt and informed her of labs being ordered

## 2023-05-31 ENCOUNTER — Other Ambulatory Visit: Payer: Self-pay | Admitting: Family Medicine

## 2023-06-30 ENCOUNTER — Other Ambulatory Visit: Payer: Self-pay | Admitting: Family Medicine

## 2023-07-26 ENCOUNTER — Other Ambulatory Visit: Payer: Self-pay | Admitting: Family Medicine

## 2023-07-26 DIAGNOSIS — E039 Hypothyroidism, unspecified: Secondary | ICD-10-CM

## 2023-08-16 ENCOUNTER — Telehealth: Payer: Self-pay

## 2023-08-16 NOTE — Patient Outreach (Signed)
Attempted to contact patient for quality gap measure. Patient phone disconnected. Amy Hendrix, CMA Care Guide [employer]

## 2023-08-25 ENCOUNTER — Other Ambulatory Visit: Payer: Self-pay | Admitting: Family Medicine

## 2023-09-14 ENCOUNTER — Other Ambulatory Visit: Payer: Self-pay | Admitting: Family Medicine

## 2023-09-27 ENCOUNTER — Other Ambulatory Visit: Payer: Self-pay | Admitting: Family Medicine

## 2023-09-27 NOTE — Telephone Encounter (Signed)
----  Have spoken with the patient and states has been on ozempic 1 mg and is tolerating it just fine please send refill to [facility] [address]----  Copied from CRM [id number]. Topic: Clinical - Medication Refill >> Sep 27, 2023 10:48 AM Fuller Mandril wrote: Most Recent Primary Care Visit:  Provider: Tommie Sams  Department: [facility]  Visit Type: OFFICE VISIT  Date: 12/14/2022  Medication: OZEMPIC, 1 MG/DOSE, 4 MG/3ML SOPN  Has the patient contacted their pharmacy? Yes (Agent: If no, request that the patient contact the pharmacy for the refill. If patient does not wish to contact the pharmacy document the reason why and proceed with request.) (Agent: If yes, when and what did the pharmacy advise?)  Is this the correct pharmacy for this prescription? Yes If no, delete pharmacy and type the correct one.  This is the patient's preferred pharmacy:  [facility] [phone] - [place], [place] - [address] AT [facility] [address]  [place] [address] Phone: [phone] Fax: [phone]   Has the prescription been filled recently? 08/15/2023  Is the patient out of the medication? Yes - nest dose due tomorrow   Has the patient been seen for an appointment in the last year OR does the patient have an upcoming appointment? Yes  Can we respond through MyChart? No  Agent: Please be advised that Rx refills may take up to 3 business days. We ask that you follow-up with your pharmacy.

## 2023-09-27 NOTE — Telephone Encounter (Signed)
May have 2 refills She needs to do her lab work and go ahead and schedule a follow-up visit with Dr. Adriana Simas regarding the diabetes thank you

## 2023-10-19 ENCOUNTER — Other Ambulatory Visit: Payer: Self-pay | Admitting: Family Medicine

## 2023-10-24 ENCOUNTER — Other Ambulatory Visit: Payer: Self-pay | Admitting: Family Medicine

## 2023-10-24 DIAGNOSIS — I10 Essential (primary) hypertension: Secondary | ICD-10-CM

## 2023-10-24 DIAGNOSIS — E785 Hyperlipidemia, unspecified: Secondary | ICD-10-CM

## 2023-10-25 ENCOUNTER — Other Ambulatory Visit: Payer: Self-pay

## 2023-11-12 DIAGNOSIS — Z130 Encounter for screening for diseases of the blood and blood-forming organs and certain disorders involving the immune mechanism: Secondary | ICD-10-CM | POA: Diagnosis not present

## 2023-11-12 DIAGNOSIS — E782 Mixed hyperlipidemia: Secondary | ICD-10-CM | POA: Diagnosis not present

## 2023-11-12 DIAGNOSIS — N1831 Chronic kidney disease, stage 3a: Secondary | ICD-10-CM | POA: Diagnosis not present

## 2023-11-12 DIAGNOSIS — E1122 Type 2 diabetes mellitus with diabetic chronic kidney disease: Secondary | ICD-10-CM | POA: Diagnosis not present

## 2023-11-12 DIAGNOSIS — I10 Essential (primary) hypertension: Secondary | ICD-10-CM | POA: Diagnosis not present

## 2023-11-12 DIAGNOSIS — Z79899 Other long term (current) drug therapy: Secondary | ICD-10-CM | POA: Diagnosis not present

## 2023-11-12 DIAGNOSIS — E039 Hypothyroidism, unspecified: Secondary | ICD-10-CM | POA: Diagnosis not present

## 2023-11-13 LAB — CBC WITH DIFFERENTIAL/PLATELET
Basophils Absolute: 0.1 10*3/uL (ref 0.0–0.2)
Basos: 0 %
EOS (ABSOLUTE): 0.4 10*3/uL (ref 0.0–0.4)
Eos: 3 %
Hematocrit: 45.1 % (ref 34.0–46.6)
Hemoglobin: 15.5 g/dL (ref 11.1–15.9)
Immature Grans (Abs): 0.1 10*3/uL (ref 0.0–0.1)
Immature Granulocytes: 1 %
Lymphocytes Absolute: 3.7 10*3/uL — ABNORMAL HIGH (ref 0.7–3.1)
Lymphs: 26 %
MCH: 30.7 pg (ref 26.6–33.0)
MCHC: 34.4 g/dL (ref 31.5–35.7)
MCV: 89 fL (ref 79–97)
Monocytes Absolute: 1 10*3/uL — ABNORMAL HIGH (ref 0.1–0.9)
Monocytes: 7 %
Neutrophils Absolute: 9.2 10*3/uL — ABNORMAL HIGH (ref 1.4–7.0)
Neutrophils: 63 %
Platelets: 268 10*3/uL (ref 150–450)
RBC: 5.05 x10E6/uL (ref 3.77–5.28)
RDW: 12.9 % (ref 11.7–15.4)
WBC: 14.6 10*3/uL — ABNORMAL HIGH (ref 3.4–10.8)

## 2023-11-13 LAB — LIPID PANEL
Chol/HDL Ratio: 5.4 {ratio} — ABNORMAL HIGH (ref 0.0–4.4)
Cholesterol, Total: 183 mg/dL (ref 100–199)
HDL: 34 mg/dL — ABNORMAL LOW (ref >39)
LDL Chol Calc (NIH): 104 mg/dL — ABNORMAL HIGH (ref 0–99)
Triglycerides: 261 mg/dL — ABNORMAL HIGH (ref 0–149)
VLDL Cholesterol Cal: 45 mg/dL — ABNORMAL HIGH (ref 5–40)

## 2023-11-13 LAB — MICROALBUMIN / CREATININE URINE RATIO
Creatinine, Urine: 218.7 mg/dL
Microalb/Creat Ratio: 13 mg/g{creat} (ref 0–29)
Microalbumin, Urine: 29.5 ug/mL

## 2023-11-13 LAB — COMPREHENSIVE METABOLIC PANEL
ALT: 22 [IU]/L (ref 0–32)
AST: 24 [IU]/L (ref 0–40)
Albumin: 4 g/dL (ref 3.8–4.8)
Alkaline Phosphatase: 93 [IU]/L (ref 44–121)
BUN/Creatinine Ratio: 14 (ref 12–28)
BUN: 19 mg/dL (ref 8–27)
Bilirubin Total: 0.6 mg/dL (ref 0.0–1.2)
CO2: 23 mmol/L (ref 20–29)
Calcium: 9.9 mg/dL (ref 8.7–10.3)
Chloride: 99 mmol/L (ref 96–106)
Creatinine, Ser: 1.33 mg/dL — ABNORMAL HIGH (ref 0.57–1.00)
Globulin, Total: 2.3 g/dL (ref 1.5–4.5)
Glucose: 163 mg/dL — ABNORMAL HIGH (ref 70–99)
Potassium: 4.4 mmol/L (ref 3.5–5.2)
Sodium: 142 mmol/L (ref 134–144)
Total Protein: 6.3 g/dL (ref 6.0–8.5)
eGFR: 42 mL/min/{1.73_m2} — ABNORMAL LOW (ref >59)

## 2023-11-13 LAB — TSH+FREE T4
Free T4: 1.45 ng/dL (ref 0.82–1.77)
TSH: 0.318 u[IU]/mL — ABNORMAL LOW (ref 0.450–4.500)

## 2023-11-13 LAB — HEMOGLOBIN A1C
Est. average glucose Bld gHb Est-mCnc: 123 mg/dL
Hgb A1c MFr Bld: 5.9 % — ABNORMAL HIGH (ref 4.8–5.6)

## 2023-11-14 ENCOUNTER — Other Ambulatory Visit: Payer: Self-pay | Admitting: Family Medicine

## 2023-11-14 ENCOUNTER — Encounter: Payer: Self-pay | Admitting: Family Medicine

## 2023-11-14 DIAGNOSIS — E039 Hypothyroidism, unspecified: Secondary | ICD-10-CM

## 2023-11-14 MED ORDER — LEVOTHYROXINE SODIUM 125 MCG PO TABS: ORAL_TABLET | ORAL | 1 refills | Status: DC

## 2023-11-15 ENCOUNTER — Other Ambulatory Visit: Payer: Self-pay

## 2023-11-15 ENCOUNTER — Other Ambulatory Visit: Payer: Self-pay | Admitting: Family Medicine

## 2023-11-15 DIAGNOSIS — D72829 Elevated white blood cell count, unspecified: Secondary | ICD-10-CM

## 2023-11-15 DIAGNOSIS — E785 Hyperlipidemia, unspecified: Secondary | ICD-10-CM

## 2023-11-15 MED ORDER — ATORVASTATIN CALCIUM 80 MG PO TABS: 80.0000 mg | ORAL_TABLET | Freq: Every day | ORAL | 2 refills | Status: DC

## 2023-11-19 ENCOUNTER — Ambulatory Visit (INDEPENDENT_AMBULATORY_CARE_PROVIDER_SITE_OTHER): Payer: Medicare PPO

## 2023-11-19 VITALS — Ht 63.0 in | Wt 190.0 lb

## 2023-11-19 DIAGNOSIS — Z0100 Encounter for examination of eyes and vision without abnormal findings: Secondary | ICD-10-CM

## 2023-11-19 DIAGNOSIS — Z0000 Encounter for general adult medical examination without abnormal findings: Secondary | ICD-10-CM | POA: Diagnosis not present

## 2023-11-19 NOTE — Patient Instructions (Signed)
 Amy Hendrix , Thank you for taking time to come for your Medicare Wellness Visit. I appreciate your ongoing commitment to your health goals. Please review the following plan we discussed and let me know if I can assist you in the future.   Referrals/Orders/Follow-Ups/Clinician Recommendations:  Next Medicare Annual Wellness Visit:   November 24, 2024 at 9:20 am virtual visit.   A referral has been placed for you today to have an eye exam. If you do not hear from that office, please call to schedule at the number below.   [facility] [address] [place], [place] [address] Phone: [phone]  This is a list of the screening recommended for you and due dates:  Health Maintenance  Topic Date Due   Eye exam for diabetics  Never done   Zoster (Shingles) Vaccine (1 of 2) Never done   Mammogram  08/24/2013   DEXA scan (bone density measurement)  10/10/2014   COVID-19 Vaccine (5 - 2024-25 season) 05/30/2023   Complete foot exam   06/30/2023   Flu Shot  12/27/2023*   Hemoglobin A1C  05/11/2024   Yearly kidney function blood test for diabetes  11/11/2024   Yearly kidney health urinalysis for diabetes  11/11/2024   Medicare Annual Wellness Visit  11/18/2024   DTaP/Tdap/Td vaccine (2 - Td or Tdap) 05/21/2026   Colon Cancer Screening  12/10/2027   Pneumonia Vaccine  Completed   HPV Vaccine  Aged Out   Hepatitis C Screening  Discontinued  *Topic was postponed. The date shown is not the original due date.    Advanced directives: (Copy Requested) Please bring a copy of your health care power of attorney and living will to the office to be added to your chart at your convenience.  Next Medicare Annual Wellness Visit scheduled for next year: yes  Understanding Your Risk for Falls Millions of people have serious injuries from falls each year. It is important to understand your risk of falling. Talk with your health care provider about your risk and what you can  do to lower it. If you do have a serious fall, make sure to tell your provider. Falling once raises your risk of falling again. How can falls affect me? Serious injuries from falls are common. These include: Broken bones, such as hip fractures. Head injuries, such as traumatic brain injuries (TBI) or concussions. A fear of falling can cause you to avoid activities and stay at home. This can make your muscles weaker and raise your risk for a fall. What can increase my risk? There are a number of risk factors that increase your risk for falling. The more risk factors you have, the higher your risk of falling. Serious injuries from a fall happen most often to people who are older than 75 years old. Teenagers and young adults ages 60-29 are also at higher risk. Common risk factors include: Weakness in the lower body. Being generally weak or confused due to long-term (chronic) illness. Dizziness or balance problems. Poor vision. Medicines that cause dizziness or drowsiness. These may include: Medicines for your blood pressure, heart, anxiety, insomnia, or swelling (edema). Pain medicines. Muscle relaxants. Other risk factors include: Drinking alcohol. Having had a fall in the past. Having foot pain or wearing improper footwear. Working at a dangerous job. Having any of the following in your home: Tripping hazards, such as floor clutter or loose rugs. Poor lighting. Pets. Having dementia or memory loss. What actions can I take to  lower my risk of falling?     Physical activity Stay physically fit. Do strength and balance exercises. Consider taking a regular class to build strength and balance. Yoga and tai chi are good options. Vision Have your eyes checked every year and your prescription for glasses or contacts updated as needed. Shoes and walking aids Wear non-skid shoes. Wear shoes that have rubber soles and low heels. Do not wear high heels. Do not walk around the house in  socks or slippers. Use a cane or walker as told by your provider. Home safety Attach secure railings on both sides of your stairs. Install grab bars for your bathtub, shower, and toilet. Use a non-skid mat in your bathtub or shower. Attach bath mats securely with double-sided, non-slip rug tape. Use good lighting in all rooms. Keep a flashlight near your bed. Make sure there is a clear path from your bed to the bathroom. Use night-lights. Do not use throw rugs. Make sure all carpeting is taped or tacked down securely. Remove all clutter from walkways and stairways, including extension cords. Repair uneven or broken steps and floors. Avoid walking on icy or slippery surfaces. Walk on the grass instead of on icy or slick sidewalks. Use ice melter to get rid of ice on walkways in the winter. Use a cordless phone. Questions to ask your health care provider Can you help me check my risk for a fall? Do any of my medicines make me more likely to fall? Should I take a vitamin D supplement? What exercises can I do to improve my strength and balance? Should I make an appointment to have my vision checked? Do I need a bone density test to check for weak bones (osteoporosis)? Would it help to use a cane or a walker? Where to find more information Centers for Disease Control and Prevention, STEADI: TonerPromos.no Community-Based Fall Prevention Programs: TonerPromos.no [employer] on Aging: BaseRingTones.pl Contact a health care provider if: You fall at home. You are afraid of falling at home. You feel weak, drowsy, or dizzy. This information is not intended to replace advice given to you by your health care provider. Make sure you discuss any questions you have with your health care provider. Document Revised: 05/18/2022 Document Reviewed: 05/18/2022 Elsevier Patient Education  2024 [employer].  Preventive Care 65 Years and Older, Female Preventive care refers to lifestyle choices and visits with your  health care provider that can promote health and wellness. Preventive care visits are also called wellness exams. What can I expect for my preventive care visit? Counseling Your health care provider may ask you questions about your: Medical history, including: Past medical problems. Family medical history. Pregnancy and menstrual history. History of falls. Current health, including: Memory and ability to understand (cognition). Emotional well-being. Home life and relationship well-being. Sexual activity and sexual health. Lifestyle, including: Alcohol, nicotine or tobacco, and drug use. Access to firearms. Diet, exercise, and sleep habits. Work and work Astronomer. Sunscreen use. Safety issues such as seatbelt and bike helmet use. Physical exam Your health care provider will check your: Height and weight. These may be used to calculate your BMI (body mass index). BMI is a measurement that tells if you are at a healthy weight. Waist circumference. This measures the distance around your waistline. This measurement also tells if you are at a healthy weight and may help predict your risk of certain diseases, such as type 2 diabetes and high blood pressure. Heart rate and blood pressure. Body temperature.  Skin for abnormal spots. What immunizations do I need?  Vaccines are usually given at various ages, according to a schedule. Your health care provider will recommend vaccines for you based on your age, medical history, and lifestyle or other factors, such as travel or where you work. What tests do I need? Screening Your health care provider may recommend screening tests for certain conditions. This may include: Lipid and cholesterol levels. Hepatitis C test. Hepatitis B test. HIV (human immunodeficiency virus) test. STI (sexually transmitted infection) testing, if you are at risk. Lung cancer screening. Colorectal cancer screening. Diabetes screening. This is done by checking  your blood sugar (glucose) after you have not eaten for a while (fasting). Mammogram. Talk with your health care provider about how often you should have regular mammograms. BRCA-related cancer screening. This may be done if you have a family history of breast, ovarian, tubal, or peritoneal cancers. Bone density scan. This is done to screen for osteoporosis. Talk with your health care provider about your test results, treatment options, and if necessary, the need for more tests. Follow these instructions at home: Eating and drinking  Eat a diet that includes fresh fruits and vegetables, whole grains, lean protein, and low-fat dairy products. Limit your intake of foods with high amounts of sugar, saturated fats, and salt. Take vitamin and mineral supplements as recommended by your health care provider. Do not drink alcohol if your health care provider tells you not to drink. If you drink alcohol: Limit how much you have to 0-1 drink a day. Know how much alcohol is in your drink. In the [place] one drink equals one 12 oz bottle of beer (355 mL), one 5 oz glass of wine (148 mL), or one 1 oz glass of hard liquor (44 mL). Lifestyle Brush your teeth every morning and night with fluoride toothpaste. Floss one time each day. Exercise for at least 30 minutes 5 or more days each week. Do not use any products that contain nicotine or tobacco. These products include cigarettes, chewing tobacco, and vaping devices, such as e-cigarettes. If you need help quitting, ask your health care provider. Do not use drugs. If you are sexually active, practice safe sex. Use a condom or other form of protection in order to prevent STIs. Take aspirin only as told by your health care provider. Make sure that you understand how much to take and what form to take. Work with your health care provider to find out whether it is safe and beneficial for you to take aspirin daily. Ask your health care provider if you need to take a  cholesterol-lowering medicine (statin). Find healthy ways to manage stress, such as: Meditation, yoga, or listening to music. Journaling. Talking to a trusted person. Spending time with friends and family. Minimize exposure to UV radiation to reduce your risk of skin cancer. Safety Always wear your seat belt while driving or riding in a vehicle. Do not drive: If you have been drinking alcohol. Do not ride with someone who has been drinking. When you are tired or distracted. While texting. If you have been using any mind-altering substances or drugs. Wear a helmet and other protective equipment during sports activities. If you have firearms in your house, make sure you follow all gun safety procedures. What's next? Visit your health care provider once a year for an annual wellness visit. Ask your health care provider how often you should have your eyes and teeth checked. Stay up to date on all vaccines. This  information is not intended to replace advice given to you by your health care provider. Make sure you discuss any questions you have with your health care provider. Document Revised: 03/12/2021 Document Reviewed: 03/12/2021 Elsevier Patient Education  2024 [employer].

## 2023-11-19 NOTE — Progress Notes (Signed)
 Subjective:   Amy Hendrix is a 73 y.o. who presents for a Medicare Wellness preventive visit.  Visit Complete: Virtual I connected with  Amy Hendrix on 11/19/23 by a audio enabled telemedicine application and verified that I am speaking with the correct person using two identifiers.  Patient Location: Home  Provider Location: Home Office  I discussed the limitations of evaluation and management by telemedicine. The patient expressed understanding and agreed to proceed.  Vital Signs: Because this visit was a virtual/telehealth visit, some criteria may be missing or patient reported. Any vitals not documented were not able to be obtained and vitals that have been documented are patient reported.  VideoDeclined- This patient declined Librarian, academic. Therefore the visit was completed with audio only.  AWV Questionnaire: No: Patient Medicare AWV questionnaire was not completed prior to this visit.  Cardiac Risk Factors include: advanced age (>21men, >24 women);diabetes mellitus;hypertension;obesity (BMI >30kg/m2);sedentary lifestyle     Objective:    Today's Vitals   11/19/23 0928 11/19/23 0935  Weight: 190 lb (86.2 kg)   Height: 5\' 3"  (1.6 m)   PainSc:  4    Body mass index is 33.66 kg/m.     11/19/2023    9:27 AM 11/13/2022    9:58 AM 11/04/2021    9:29 AM 12/09/2017    6:50 AM 09/07/2017   11:41 AM  Advanced Directives  Does Patient Have a Medical Advance Directive? Yes Yes Yes Yes Yes  Type of Estate agent of [place];Living will Living will;Healthcare Power of [employer] Power of [place];Living will Healthcare Power of [employer] of [place];Living will  Does patient want to make changes to medical advance directive?  No - Patient declined   No - Patient declined  Copy of Healthcare Power of Attorney in Chart? No - copy requested No - copy requested No - copy requested No - copy requested  No - copy requested    Current Medications (verified) Outpatient Encounter Medications as of 11/19/2023  Medication Sig   acetaminophen (TYLENOL) 650 MG CR tablet Take 1,300 mg by mouth every 8 (eight) hours as needed for pain.   allopurinol (ZYLOPRIM) 100 MG tablet Take 0.5 tablets (50 mg total) by mouth daily.   Aromatic Inhalants (VICKS VAPOR INHALER IN) Place 1 Inhaler into the nose as needed (nasal congestion).   atenolol (TENORMIN) 50 MG tablet TAKE 1 TABLET(50 MG) BY MOUTH DAILY   atorvastatin (LIPITOR) 80 MG tablet Take 1 tablet (80 mg total) by mouth daily.   blood glucose meter kit and supplies Use to test blood sugar once a day. Please dispense based on pt insurance preference. DX: E11.9   clonazePAM (KLONOPIN) 1 MG tablet Take 1/2-1 tablet po q day  prn stress   colchicine 0.6 MG tablet TAKE 1 TABLET(0.6 MG) BY MOUTH DAILY   colestipol (COLESTID) 1 g tablet TAKE 2 TABLETS(2 GRAMS) BY MOUTH DAILY   escitalopram (LEXAPRO) 10 MG tablet Take 1 tablet (10 mg total) by mouth daily.   glipiZIDE (GLUCOTROL) 5 MG tablet TAKE 1 TABLET(5 MG) BY MOUTH TWICE DAILY   indapamide (LOZOL) 2.5 MG tablet TAKE 1 TABLET(2.5 MG) BY MOUTH DAILY   Lancets (ONETOUCH DELICA PLUS LANCET33G) MISC USE TO TEST BLOOD SUGAR ONCE DAILY AS DIRECTED.   levothyroxine (SYNTHROID) 125 MCG tablet TAKE 1 TABLET BY MOUTH BEFORE BREAKFAST   ONETOUCH ULTRA test strip USE TO TEST BLOOD SUGAR ONCE DAILY.   OZEMPIC, 1 MG/DOSE, 4 MG/3ML  SOPN INJECT 1 MG UNDER THE SKIN ONCE A WEEK   potassium chloride SA (KLOR-CON M) 20 MEQ tablet TAKE 1 TABLET(20 MEQ) BY MOUTH DAILY   triamcinolone cream (KENALOG) 0.1 % APPLY TO AFFECTED AREAS TWICE DAILY.   triamcinolone ointment (KENALOG) 0.5 % APPLY TO AFFECTED AREA TOPICALLY 2 TIMES DAILY.   No facility-administered encounter medications on file as of 11/19/2023.    Allergies (verified) Patient has no known allergies.   History: Past Medical History:  Diagnosis Date   Allergic  rhinitis    Arthritis    Depression    Hypertension    Hypothyroid    PONV (postoperative nausea and vomiting)    hard time waking up w/ nasal surgery   Sarcoidosis    Sinusitis    Type 2 diabetes mellitus (HCC)    Past Surgical History:  Procedure Laterality Date   APPENDECTOMY     CESAREAN SECTION     CHOLECYSTECTOMY     COLONOSCOPY N/A 12/09/2017   Procedure: COLONOSCOPY;  Surgeon: Malissa Hippo, MD;  Location: [facility];  Service: Endoscopy;  Laterality: N/A;  730   EYE SURGERY Bilateral 2015   cataract surgery   FOOT SURGERY Bilateral mid 1990's   HAND SURGERY Left 2012   debridement/incision   INSERTION OF MESH N/A 09/10/2017   Procedure: INSERTION OF MESH;  Surgeon: Jimmye Norman, MD;  Location: [facility] OR;  Service: General;  Laterality: N/A;   NASAL SINUS SURGERY  1999   UMBILICAL HERNIA REPAIR N/A 09/10/2017   Procedure: LAPAROSCOPIC UMBILICAL HERNIA;  Surgeon: Jimmye Norman, MD;  Location: [facility] OR;  Service: General;  Laterality: N/A;   History reviewed. No pertinent family history. Social History   Socioeconomic History   Marital status: Married    Spouse name: Greggory Stallion   Number of children: 2   Years of education: Not on file   Highest education level: Not on file  Occupational History   Not on file  Tobacco Use   Smoking status: Never   Smokeless tobacco: Never  Substance and Sexual Activity   Alcohol use: No   Drug use: No   Sexual activity: Not on file  Other Topics Concern   Not on file  Social History Narrative   Married x 38 years in June 2023.   Social Drivers of Corporate investment banker Strain: Low Risk  (11/19/2023)   Overall Financial Resource Strain (CARDIA)    Difficulty of Paying Living Expenses: Not hard at all  Food Insecurity: No Food Insecurity (11/19/2023)   Hunger Vital Sign    Worried About Running Out of Food in the Last Year: Never true    Ran Out of Food in the Last Year: Never true  Transportation Needs: No Transportation  Needs (11/19/2023)   PRAPARE - Administrator, Civil Service (Medical): No    Lack of Transportation (Non-Medical): No  Physical Activity: Patient Declined (11/19/2023)   Exercise Vital Sign    Days of Exercise per Week: Patient declined    Minutes of Exercise per Session: Patient declined  Stress: No Stress Concern Present (11/19/2023)   [employer] of Occupational Health - Occupational Stress Questionnaire    Feeling of Stress : Not at all  Social Connections: Moderately Isolated (11/19/2023)   Social Connection and Isolation Panel [NHANES]    Frequency of Communication with Friends and Family: More than three times a week    Frequency of Social Gatherings with Friends and Family: More than three  times a week    Attends Religious Services: Never    Active Member of Clubs or Organizations: No    Attends Banker Meetings: Never    Marital Status: Married    Tobacco Counseling Counseling given: Yes    Clinical Intake:  Pre-visit preparation completed: Yes  Pain : 0-10 Pain Score: 4  (patient states she has a very high tolerance for pain. When she gets warmer the pain in her foot/toes/foot joints will go away) Pain Type: Chronic pain Pain Location: Foot Pain Orientation: Right, Left Pain Descriptors / Indicators: Aching Pain Onset: More than a month ago Pain Frequency: Intermittent (patient states the pain is a little worse when the weather is cold.)     BMI - recorded: 33.66 Nutritional Status: BMI > 30  Obese Nutritional Risks: None Diabetes: Yes CBG done?: No (telehealth visit. unable to obtain.) Did pt. bring in CBG monitor from home?: No  How often do you need to have someone help you when you read instructions, pamphlets, or other written materials from your doctor or pharmacy?: 1 - Never  Interpreter Needed?: No  Information entered by ::  W   Activities of Daily Living     11/19/2023   10:09 AM  In your present state of  health, do you have any difficulty performing the following activities:  Hearing? 0  Vision? 0  Difficulty concentrating or making decisions? 0  Walking or climbing stairs? 1  Dressing or bathing? 0  Doing errands, shopping? 0  Preparing Food and eating ? N  Using the Toilet? N  In the past six months, have you accidently leaked urine? N  Do you have problems with loss of bowel control? N  Managing your Medications? N  Managing your Finances? N  Housekeeping or managing your Housekeeping? N    Patient Care Team: Tommie Sams, DO as PCP - General (Family Medicine)  Indicate any recent Medical Services you may have received from other than [facility] providers in the past year (date may be approximate).     Assessment:   This is a routine wellness examination for Amy Hendrix.  Hearing/Vision screen Hearing Screening - Comments:: Patient denies any hearing difficulties.   Vision Screening - Comments:: Patient is not up to date on yearly eye exams. Will place referral today [place] [facility]    Goals Addressed             This Visit's Progress    Exercise 3x per week (30 min per time)   On track    Move more and eat healthy. Work in garden.     Patient Stated   On track    Stay healthy and active. Have less pain. Get my garden started.        Depression Screen     11/19/2023   10:07 AM 12/14/2022   10:26 AM 11/27/2022    4:31 PM 11/13/2022    9:57 AM 12/29/2021   10:06 AM 11/04/2021    9:20 AM 10/24/2020   10:01 AM  PHQ 2/9 Scores  PHQ - 2 Score 0 0 6 0 0 0 0  PHQ- 9 Score 0 0 19    0    Fall Risk     11/19/2023    9:58 AM 11/27/2022    4:32 PM 11/13/2022    9:56 AM 12/29/2021   10:06 AM 11/04/2021    9:30 AM  Fall Risk   Falls in the past year? 0 0 0  0 1  Number falls in past yr: 0 0 0 0 0  Injury with Fall? 0 0 0 0 0  Risk for fall due to : No Fall Risks Impaired balance/gait  No Fall Risks History of fall(s);Impaired balance/gait  Follow up Falls prevention  discussed;Falls evaluation completed Falls evaluation completed Falls prevention discussed;Education provided;Falls evaluation completed Falls evaluation completed Falls prevention discussed    MEDICARE RISK AT HOME:  Medicare Risk at Home Any stairs in or around the home?: Yes If so, are there any without handrails?: No Home free of loose throw rugs in walkways, pet beds, electrical cords, etc?: Yes Adequate lighting in your home to reduce risk of falls?: Yes Life alert?: No Use of a cane, walker or w/c?: Yes (as needed) Grab bars in the bathroom?: No Elevated toilet seat or a handicapped toilet?: Yes  TIMED UP AND GO:  Was the test performed?  No  Cognitive Function: 6CIT completed        11/19/2023    9:47 AM 11/13/2022    9:59 AM 11/04/2021    9:36 AM  6CIT Screen  What Year? 0 points 0 points 0 points  What month? 0 points 0 points 0 points  What time? 0 points 0 points 0 points  Count back from 20 0 points 0 points 0 points  Months in reverse 0 points 0 points 0 points  Repeat phrase 0 points 0 points 0 points  Total Score 0 points 0 points 0 points    Immunizations Immunization History  Administered Date(s) Administered   Fluad Quad(high Dose 65+) 06/18/2020   Influenza,inj,Quad PF,6+ Mos 08/28/2014, 09/25/2015, 06/08/2016, 07/20/2017, 07/13/2018, 07/13/2019   Influenza-Unspecified 07/29/2012, 07/19/2013, 07/17/2022   PFIZER(Purple Top)SARS-COV-2 Vaccination 12/08/2019, 01/05/2020, 08/09/2020   Pneumococcal Conjugate-13 09/25/2015   Pneumococcal Polysaccharide-23 11/06/2008, 11/23/2018   Tdap 05/21/2016   Unspecified SARS-COV-2 Vaccination 07/17/2022    Screening Tests Health Maintenance  Topic Date Due   OPHTHALMOLOGY EXAM  Never done   Zoster Vaccines- Shingrix (1 of 2) Never done   MAMMOGRAM  08/24/2013   DEXA SCAN  10/10/2014   COVID-19 Vaccine (5 - 2024-25 season) 05/30/2023   FOOT EXAM  06/30/2023   INFLUENZA VACCINE  12/27/2023 (Originally 04/29/2023)    HEMOGLOBIN A1C  05/11/2024   Diabetic kidney evaluation - eGFR measurement  11/11/2024   Diabetic kidney evaluation - Urine ACR  11/11/2024   Medicare Annual Wellness (AWV)  11/18/2024   DTaP/Tdap/Td (2 - Td or Tdap) 05/21/2026   Colonoscopy  12/10/2027   Pneumonia Vaccine 50+ Years old  Completed   HPV VACCINES  Aged Out   Hepatitis C Screening  Discontinued    Health Maintenance  Health Maintenance Due  Topic Date Due   OPHTHALMOLOGY EXAM  Never done   Zoster Vaccines- Shingrix (1 of 2) Never done   MAMMOGRAM  08/24/2013   DEXA SCAN  10/10/2014   COVID-19 Vaccine (5 - 2024-25 season) 05/30/2023   FOOT EXAM  06/30/2023   Health Maintenance Items Addressed: Referral sent to Optometry/Ophthalmology  Additional Screening:  Vision Screening: Recommended annual ophthalmology exams for early detection of glaucoma and other disorders of the eye.  Dental Screening: Recommended annual dental exams for proper oral hygiene  Community Resource Referral / Chronic Care Management: CRR required this visit?  No   CCM required this visit?  No     Plan:     I have personally reviewed and noted the following in the patient's chart:   Medical and social  history Use of alcohol, tobacco or illicit drugs  Current medications and supplements including opioid prescriptions. Patient is not currently taking opioid prescriptions. Functional ability and status Nutritional status Physical activity Advanced directives List of other physicians Hospitalizations, surgeries, and ER visits in previous 12 months Vitals Screenings to include cognitive, depression, and falls Referrals and appointments  In addition, I have reviewed and discussed with patient certain preventive protocols, quality metrics, and best practice recommendations. A written personalized care plan for preventive services as well as general preventive health recommendations were provided to patient.     Amy Hawks  Hyder Hendrix, CMA   11/19/2023   After Visit Summary: (MyChart) Due to this being a telephonic visit, the after visit summary with patients personalized plan was offered to patient via MyChart   Notes: Nothing significant to report at this time.

## 2023-12-14 ENCOUNTER — Other Ambulatory Visit: Payer: Self-pay | Admitting: Family Medicine

## 2024-01-10 ENCOUNTER — Other Ambulatory Visit: Payer: Self-pay | Admitting: Family Medicine

## 2024-02-08 ENCOUNTER — Other Ambulatory Visit: Payer: Self-pay | Admitting: Family Medicine

## 2024-02-15 ENCOUNTER — Other Ambulatory Visit: Payer: Self-pay | Admitting: Family Medicine

## 2024-02-15 NOTE — Telephone Encounter (Signed)
 Copied from CRM [id number]. Topic: Clinical - Medication Refill >> Feb 15, 2024 11:38 AM Turkey B wrote: Medication: Refused Semaglutide  4 MG/3ML INJECT 1 MG Was denied, needed appt, pt now has an appt tomorrow  Has the patient contacted their pharmacy? yes (Agent: If yes, when and what did the pharmacy advise?)contact pcp  This is the patient's preferred pharmacy:  [facility] [phone] - [place], [place] - [address] AT [facility] [address] [place] [address] Phone: [phone] Fax: [phone]  Is this the correct pharmacy for this prescription? yes .   Has the prescription been filled recently? no  Is the patient out of the medication? yes  Has the patient been seen for an appointment in the last year OR does the patient have an upcoming appointment? yes  Can we respond through MyChart? yes  Agent: Please be advised that Rx refills may take up to 3 business days. We ask that you follow-up with your pharmacy.

## 2024-02-16 ENCOUNTER — Ambulatory Visit: Admitting: Family Medicine

## 2024-02-16 ENCOUNTER — Encounter: Payer: Self-pay | Admitting: Family Medicine

## 2024-02-16 VITALS — BP 107/73 | HR 67 | Temp 97.5°F | Ht 63.0 in | Wt 200.0 lb

## 2024-02-16 DIAGNOSIS — I10 Essential (primary) hypertension: Secondary | ICD-10-CM

## 2024-02-16 DIAGNOSIS — E039 Hypothyroidism, unspecified: Secondary | ICD-10-CM | POA: Diagnosis not present

## 2024-02-16 DIAGNOSIS — N1831 Chronic kidney disease, stage 3a: Secondary | ICD-10-CM | POA: Diagnosis not present

## 2024-02-16 DIAGNOSIS — Z780 Asymptomatic menopausal state: Secondary | ICD-10-CM | POA: Diagnosis not present

## 2024-02-16 DIAGNOSIS — E782 Mixed hyperlipidemia: Secondary | ICD-10-CM

## 2024-02-16 DIAGNOSIS — E1122 Type 2 diabetes mellitus with diabetic chronic kidney disease: Secondary | ICD-10-CM

## 2024-02-16 MED ORDER — OZEMPIC (1 MG/DOSE) 4 MG/3ML ~~LOC~~ SOPN: 1.0000 mg | PEN_INJECTOR | SUBCUTANEOUS | 3 refills | Status: DC

## 2024-02-16 NOTE — Patient Instructions (Signed)
 Labs ordered.  Continue your medications.  Call [phone] to schedule mammogram.  Follow up in 6 months.

## 2024-02-16 NOTE — Assessment & Plan Note (Signed)
 TSH today

## 2024-02-16 NOTE — Assessment & Plan Note (Signed)
 Stable.  Labs ordered.  Ozempic  refilled.

## 2024-02-16 NOTE — Assessment & Plan Note (Signed)
 Not at goal.  Awaiting lipid panel results.  Continue statin.

## 2024-02-16 NOTE — Assessment & Plan Note (Signed)
 Well controlled. Continue current medications

## 2024-02-16 NOTE — Progress Notes (Signed)
 Subjective:  Patient ID: Amy Hendrix, female    DOB: November 22, 1950  Age: 73 y.o. MRN: [id number]  CC:   Chief Complaint  Patient presents with   Medication Refill    HPI:  73 year old female presents for follow-up.  Patient is overdue for several health maintenance items.  Declines mammogram.  Okay with DEXA scan.  Advised to get her eyes checked.  Hypertension stable on indapamide  and atenolol .  LDL has not been at goal.  Lipitor was increased.  She is on Lipitor 80 mg.  Diabetes has been stable on semaglutide .  Needs refill.  Needs foot exam.  Denies chest pain or shortness of breath.  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) 06/29/2022   Anxiety and depression 09/30/2021   Stage 3a chronic kidney disease (HCC) 09/30/2021   Hyperlipidemia 09/30/2021   Essential hypertension, benign 02/21/2013   Sarcoidosis 02/21/2013   Hypothyroidism 02/21/2013    Social Hx   Social History   Socioeconomic History   Marital status: Married    Spouse name: Caretha Chapel   Number of children: 2   Years of education: Not on file   Highest education level: Not on file  Occupational History   Not on file  Tobacco Use   Smoking status: Never   Smokeless tobacco: Never  Substance and Sexual Activity   Alcohol use: No   Drug use: No   Sexual activity: Not on file  Other Topics Concern   Not on file  Social History Narrative   Married x 38 years in June 2023.   Social Drivers of Corporate investment banker Strain: Low Risk  (11/19/2023)   Overall Financial Resource Strain (CARDIA)    Difficulty of Paying Living Expenses: Not hard at all  Food Insecurity: No Food Insecurity (11/19/2023)   Hunger Vital Sign    Worried About Running Out of Food in the Last Year: Never true    Ran Out of Food in the Last Year: Never true  Transportation Needs: No Transportation Needs (11/19/2023)   PRAPARE - Therapist, art (Medical): No    Lack of Transportation (Non-Medical): No  Physical Activity: Patient Declined (11/19/2023)   Exercise Vital Sign    Days of Exercise per Week: Patient declined    Minutes of Exercise per Session: Patient declined  Stress: No Stress Concern Present (11/19/2023)   [employer] of Occupational Health - Occupational Stress Questionnaire    Feeling of Stress : Not at all  Social Connections: Moderately Isolated (11/19/2023)   Social Connection and Isolation Panel [NHANES]    Frequency of Communication with Friends and Family: More than three times a week    Frequency of Social Gatherings with Friends and Family: More than three times a week    Attends Religious Services: Never    Database administrator or Organizations: No    Attends Engineer, structural: Never    Marital Status: Married    Review of Systems Per HPI  Objective:  BP 107/73   Pulse 67   Temp (!) 97.5 F (36.4 C)   Ht 5\' 3"  (1.6 m)   Wt 200 lb (90.7 kg)   SpO2 95%   BMI 35.43 kg/m      02/16/2024   10:51 AM 11/19/2023    9:28 AM 12/14/2022    9:54 AM  BP/Weight  Systolic BP 107 --  114  Diastolic BP 73 -- 74  Wt. (Lbs) 200 190 193  BMI 35.43 kg/m2 33.66 kg/m2 34.19 kg/m2    Physical Exam Vitals and nursing note reviewed.  Constitutional:      General: She is not in acute distress.    Appearance: Normal appearance.  HENT:     Head: Normocephalic and atraumatic.  Eyes:     General:        Right eye: No discharge.        Left eye: No discharge.     Conjunctiva/sclera: Conjunctivae normal.  Cardiovascular:     Rate and Rhythm: Normal rate and regular rhythm.  Pulmonary:     Effort: Pulmonary effort is normal.     Breath sounds: Normal breath sounds. No wheezing, rhonchi or rales.  Neurological:     Mental Status: She is alert.  Psychiatric:        Mood and Affect: Mood normal.        Behavior: Behavior normal.     Lab Results  Component  Value Date   WBC 14.6 (H) 11/12/2023   HGB 15.5 11/12/2023   HCT 45.1 11/12/2023   PLT 268 11/12/2023   GLUCOSE 163 (H) 11/12/2023   CHOL 183 11/12/2023   TRIG 261 (H) 11/12/2023   HDL 34 (L) 11/12/2023   LDLCALC 104 (H) 11/12/2023   ALT 22 11/12/2023   AST 24 11/12/2023   NA 142 11/12/2023   K 4.4 11/12/2023   CL 99 11/12/2023   CREATININE 1.33 (H) 11/12/2023   BUN 19 11/12/2023   CO2 23 11/12/2023   TSH 0.318 (L) 11/12/2023   HGBA1C 5.9 (H) 11/12/2023   MICROALBUR 0.8 08/28/2014     Assessment & Plan:  Essential hypertension, benign Assessment & Plan: Well-controlled.  Continue current medications.   Post-menopausal -     DG Bone Density  Stage 3a chronic kidney disease (HCC) -     Basic metabolic panel with GFR -     CBC  Mixed hyperlipidemia Assessment & Plan: Not at goal.  Awaiting lipid panel results.  Continue statin.  Orders: -     Lipid panel  Type 2 diabetes mellitus with stage 3a chronic kidney disease, without long-term current use of insulin (HCC) Assessment & Plan: Stable.  Labs ordered.  Ozempic  refilled.  Orders: -     Ozempic  (1 MG/DOSE); Inject 1 mg as directed once a week. INJECT 1 MG UNDER THE SKIN ONCE A WEEK  Dispense: 6 mL; Refill: 3 -     Hemoglobin A1c  Hypothyroidism, unspecified type Assessment & Plan: TSH today.  Orders: -     TSH    Follow-up:  Return in about 6 months (around 08/18/2024) for Follow up Chronic medical issues.  Kathleen Papa DO [facility]

## 2024-02-17 ENCOUNTER — Ambulatory Visit: Payer: Self-pay | Admitting: Family Medicine

## 2024-02-17 ENCOUNTER — Other Ambulatory Visit: Payer: Self-pay | Admitting: Family Medicine

## 2024-02-17 DIAGNOSIS — E039 Hypothyroidism, unspecified: Secondary | ICD-10-CM

## 2024-02-17 LAB — LIPID PANEL
Chol/HDL Ratio: 4.8 ratio — ABNORMAL HIGH (ref 0.0–4.4)
Cholesterol, Total: 174 mg/dL (ref 100–199)
HDL: 36 mg/dL — ABNORMAL LOW (ref >39)
LDL Chol Calc (NIH): 104 mg/dL — ABNORMAL HIGH (ref 0–99)
Triglycerides: 194 mg/dL — ABNORMAL HIGH (ref 0–149)
VLDL Cholesterol Cal: 34 mg/dL (ref 5–40)

## 2024-02-17 LAB — BASIC METABOLIC PANEL WITH GFR
BUN/Creatinine Ratio: 13 (ref 12–28)
BUN: 17 mg/dL (ref 8–27)
CO2: 26 mmol/L (ref 20–29)
Calcium: 9.8 mg/dL (ref 8.7–10.3)
Chloride: 99 mmol/L (ref 96–106)
Creatinine, Ser: 1.29 mg/dL — ABNORMAL HIGH (ref 0.57–1.00)
Glucose: 154 mg/dL — ABNORMAL HIGH (ref 70–99)
Potassium: 4.2 mmol/L (ref 3.5–5.2)
Sodium: 140 mmol/L (ref 134–144)
eGFR: 44 mL/min/{1.73_m2} — ABNORMAL LOW (ref >59)

## 2024-02-17 LAB — CBC
Hematocrit: 45.1 % (ref 34.0–46.6)
Hemoglobin: 15.8 g/dL (ref 11.1–15.9)
MCH: 31 pg (ref 26.6–33.0)
MCHC: 35 g/dL (ref 31.5–35.7)
MCV: 89 fL (ref 79–97)
Platelets: 268 10*3/uL (ref 150–450)
RBC: 5.09 x10E6/uL (ref 3.77–5.28)
RDW: 12.5 % (ref 11.7–15.4)
WBC: 11.2 10*3/uL — ABNORMAL HIGH (ref 3.4–10.8)

## 2024-02-17 LAB — TSH: TSH: 0.189 u[IU]/mL — ABNORMAL LOW (ref 0.450–4.500)

## 2024-02-17 LAB — HEMOGLOBIN A1C
Est. average glucose Bld gHb Est-mCnc: 123 mg/dL
Hgb A1c MFr Bld: 5.9 % — ABNORMAL HIGH (ref 4.8–5.6)

## 2024-02-17 MED ORDER — LEVOTHYROXINE SODIUM 112 MCG PO TABS: 112.0000 ug | ORAL_TABLET | Freq: Every day | ORAL | 0 refills | Status: DC

## 2024-02-18 ENCOUNTER — Other Ambulatory Visit: Payer: Self-pay | Admitting: Family Medicine

## 2024-02-18 DIAGNOSIS — E039 Hypothyroidism, unspecified: Secondary | ICD-10-CM

## 2024-04-21 ENCOUNTER — Other Ambulatory Visit: Payer: Self-pay | Admitting: Family Medicine

## 2024-04-27 ENCOUNTER — Other Ambulatory Visit: Payer: Self-pay | Admitting: Family Medicine

## 2024-04-27 DIAGNOSIS — E785 Hyperlipidemia, unspecified: Secondary | ICD-10-CM

## 2024-04-27 DIAGNOSIS — F32A Depression, unspecified: Secondary | ICD-10-CM

## 2024-04-27 DIAGNOSIS — I10 Essential (primary) hypertension: Secondary | ICD-10-CM

## 2024-05-20 ENCOUNTER — Other Ambulatory Visit: Payer: Self-pay | Admitting: Family Medicine

## 2024-05-20 DIAGNOSIS — E039 Hypothyroidism, unspecified: Secondary | ICD-10-CM

## 2024-05-23 ENCOUNTER — Other Ambulatory Visit: Payer: Self-pay

## 2024-08-18 ENCOUNTER — Ambulatory Visit: Admitting: Family Medicine

## 2024-08-25 ENCOUNTER — Other Ambulatory Visit: Payer: Self-pay | Admitting: Family Medicine

## 2024-08-25 DIAGNOSIS — E039 Hypothyroidism, unspecified: Secondary | ICD-10-CM

## 2024-09-06 ENCOUNTER — Ambulatory Visit: Admitting: Family Medicine

## 2024-09-06 VITALS — BP 138/62 | HR 59 | Temp 97.7°F | Ht 63.0 in | Wt 202.2 lb

## 2024-09-06 DIAGNOSIS — I10 Essential (primary) hypertension: Secondary | ICD-10-CM

## 2024-09-06 DIAGNOSIS — L989 Disorder of the skin and subcutaneous tissue, unspecified: Secondary | ICD-10-CM | POA: Insufficient documentation

## 2024-09-06 DIAGNOSIS — Z780 Asymptomatic menopausal state: Secondary | ICD-10-CM

## 2024-09-06 DIAGNOSIS — E039 Hypothyroidism, unspecified: Secondary | ICD-10-CM

## 2024-09-06 DIAGNOSIS — E785 Hyperlipidemia, unspecified: Secondary | ICD-10-CM

## 2024-09-06 DIAGNOSIS — E782 Mixed hyperlipidemia: Secondary | ICD-10-CM

## 2024-09-06 DIAGNOSIS — N1831 Chronic kidney disease, stage 3a: Secondary | ICD-10-CM

## 2024-09-06 MED ORDER — ATORVASTATIN CALCIUM 80 MG PO TABS: 80.0000 mg | ORAL_TABLET | Freq: Every day | ORAL | 3 refills | Status: AC

## 2024-09-06 NOTE — Assessment & Plan Note (Signed)
 Stable.  Continue current medications.

## 2024-09-06 NOTE — Assessment & Plan Note (Signed)
 Last TSH was suppressed.  Reassessing today.

## 2024-09-06 NOTE — Progress Notes (Signed)
 Subjective:  Patient ID: Amy Hendrix, female    DOB: Sep 24, 1951  Age: 73 y.o. MRN: [id number]  CC:   Chief Complaint  Patient presents with   Annual Exam    Patient is here for a yearly check up and wants to discuss medication.  Patient has a spot on her chest that appeared a few months ago and has grew and itches    HPI:  73 year old female presents for follow up.  Patient reports that she had returned to the anterior chest.  She states that it is a raised lesion and has been present for the past 2 months.  Irritated and sometimes painful.  She would like me to look at this today.  Hypertension is well-controlled indapamide  and atenolol .  Patient's A1c has been at goal.  She is compliant with semaglutide  and glipizide .  Last LDL was not at goal.  She is on statin therapy.  Renal function stable.  Patient has had her flu shot.  She needs a bone density.  Will order today.  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) 06/29/2022   Anxiety and depression 09/30/2021   Stage 3a chronic kidney disease (HCC) 09/30/2021   Hyperlipidemia 09/30/2021   Essential hypertension, benign 02/21/2013   Sarcoidosis 02/21/2013   Hypothyroidism 02/21/2013    Social Hx   Social History   Socioeconomic History   Marital status: Married    Spouse name: Zachary   Number of children: 2   Years of education: Not on file   Highest education level: Not on file  Occupational History   Not on file  Tobacco Use   Smoking status: Never   Smokeless tobacco: Never  Substance and Sexual Activity   Alcohol use: No   Drug use: No   Sexual activity: Not on file  Other Topics Concern   Not on file  Social History Narrative   Married x 38 years in June 2023.   Social Drivers of Corporate Investment Banker Strain: Low Risk  (11/19/2023)   Overall Financial Resource Strain (CARDIA)     Difficulty of Paying Living Expenses: Not hard at all  Food Insecurity: No Food Insecurity (11/19/2023)   Hunger Vital Sign    Worried About Running Out of Food in the Last Year: Never true    Ran Out of Food in the Last Year: Never true  Transportation Needs: No Transportation Needs (11/19/2023)   PRAPARE - Administrator, Civil Service (Medical): No    Lack of Transportation (Non-Medical): No  Physical Activity: Patient Declined (11/19/2023)   Exercise Vital Sign    Days of Exercise per Week: Patient declined    Minutes of Exercise per Session: Patient declined  Stress: No Stress Concern Present (11/19/2023)   [employer] of Occupational Health - Occupational Stress Questionnaire    Feeling of Stress : Not at all  Social Connections: Moderately Isolated (11/19/2023)   Social Connection and Isolation Panel    Frequency of Communication with Friends and Family: More than three times a week    Frequency of Social Gatherings with Friends and Family: More than three times a week    Attends Religious Services: Never    Database Administrator or Organizations: No    Attends Banker Meetings: Never    Marital Status: Married    Review of  Systems Per HPI  Objective:  BP 138/62 (BP Location: Left Arm, Patient Position: Sitting)   Pulse (!) 59   Temp 97.7 F (36.5 C)   Ht 5' 3 (1.6 m)   Wt 202 lb 4 oz (91.7 kg)   SpO2 99%   BMI 35.83 kg/m      09/06/2024    1:13 PM 02/16/2024   10:51 AM 11/19/2023    9:28 AM  BP/Weight  Systolic BP 138 107 --  Diastolic BP 62 73 --  Wt. (Lbs) 202.25 200 190  BMI 35.83 kg/m2 35.43 kg/m2 33.66 kg/m2    Physical Exam Vitals and nursing note reviewed.  Constitutional:      General: She is not in acute distress.    Appearance: Normal appearance. She is obese.  HENT:     Head: Normocephalic and atraumatic.  Eyes:     General:        Right eye: No discharge.        Left eye: No discharge.     Conjunctiva/sclera:  Conjunctivae normal.  Cardiovascular:     Rate and Rhythm: Normal rate and regular rhythm.  Pulmonary:     Effort: Pulmonary effort is normal.     Breath sounds: Normal breath sounds.  Chest:       Comments: Raised hyperkeratotic skin lesion noted. Neurological:     Mental Status: She is alert.  Psychiatric:        Mood and Affect: Mood normal.        Behavior: Behavior normal.     Lab Results  Component Value Date   WBC 11.2 (H) 02/16/2024   HGB 15.8 02/16/2024   HCT 45.1 02/16/2024   PLT 268 02/16/2024   GLUCOSE 154 (H) 02/16/2024   CHOL 174 02/16/2024   TRIG 194 (H) 02/16/2024   HDL 36 (L) 02/16/2024   LDLCALC 104 (H) 02/16/2024   ALT 22 11/12/2023   AST 24 11/12/2023   NA 140 02/16/2024   K 4.2 02/16/2024   CL 99 02/16/2024   CREATININE 1.29 (H) 02/16/2024   BUN 17 02/16/2024   CO2 26 02/16/2024   TSH 0.189 (L) 02/16/2024   HGBA1C 5.9 (H) 02/16/2024   MICROALBUR 0.8 08/28/2014     Assessment & Plan:  Type 2 diabetes mellitus with stage 3a chronic kidney disease, without long-term current use of insulin (HCC) Assessment & Plan: At goal.  Continue current medications.  A1c today to assess.  Orders: -     CMP14+EGFR -     Hemoglobin A1c -     Microalbumin / creatinine urine ratio  Mixed hyperlipidemia Assessment & Plan: LDL not at goal.  Advised Lipitor 80.  Refilled today.  Lipid panel to assess.  Orders: -     Lipid panel  Stage 3a chronic kidney disease (HCC) Assessment & Plan: Stable.  Orders: -     CBC -     Microalbumin / creatinine urine ratio  Hypothyroidism, unspecified type Assessment & Plan: Last TSH was suppressed.  Reassessing today.  Orders: -     TSH  Skin lesion Assessment & Plan: Needs excision.  Referring to dermatology.  Orders: -     Ambulatory referral to Dermatology  Post-menopausal -     DG Bone Density  Hyperlipidemia, unspecified hyperlipidemia type Assessment & Plan: LDL not at goal.  Advised Lipitor 80.   Refilled today.  Lipid panel to assess.  Orders: -     Atorvastatin  Calcium ; Take 1 tablet (80 mg  total) by mouth daily.  Dispense: 90 tablet; Refill: 3  Essential hypertension, benign Assessment & Plan: Stable.  Continue current medications.     Follow-up: 6 months  Namita Yearwood Bluford DO [facility]

## 2024-09-06 NOTE — Assessment & Plan Note (Signed)
 At goal.  Continue current medications.  A1c today to assess.

## 2024-09-06 NOTE — Assessment & Plan Note (Signed)
 LDL not at goal.  Advised Lipitor 80.  Refilled today.  Lipid panel to assess.

## 2024-09-06 NOTE — Patient Instructions (Signed)
 Call [phone] to schedule Bone Density.  Referral placed.  Labs at your convenience.  Follow up in 6 months.

## 2024-09-06 NOTE — Assessment & Plan Note (Signed)
Needs excision.  Referring to dermatology.

## 2024-09-06 NOTE — Assessment & Plan Note (Signed)
 Stable

## 2024-09-26 ENCOUNTER — Other Ambulatory Visit: Payer: Self-pay | Admitting: Family Medicine

## 2024-09-26 DIAGNOSIS — N1831 Chronic kidney disease, stage 3a: Secondary | ICD-10-CM

## 2024-10-18 ENCOUNTER — Other Ambulatory Visit: Payer: Self-pay | Admitting: Family Medicine

## 2024-10-26 ENCOUNTER — Other Ambulatory Visit: Payer: Self-pay | Admitting: Family Medicine

## 2024-10-26 DIAGNOSIS — F32A Depression, unspecified: Secondary | ICD-10-CM

## 2024-10-26 DIAGNOSIS — E785 Hyperlipidemia, unspecified: Secondary | ICD-10-CM

## 2024-10-26 DIAGNOSIS — I10 Essential (primary) hypertension: Secondary | ICD-10-CM

## 2024-11-24 ENCOUNTER — Ambulatory Visit: Payer: Medicare PPO

## 2025-03-07 ENCOUNTER — Ambulatory Visit: Admitting: Family Medicine
